# Patient Record
Sex: Female | Born: 1950 | Hispanic: No | Marital: Married | Smoking: Never smoker
Health system: Southern US, Community
[De-identification: ages and names within clinical notes are randomized; demographics above are authoritative.]

## PROBLEM LIST (undated history)

## (undated) DIAGNOSIS — K279 Peptic ulcer, site unspecified, unspecified as acute or chronic, without hemorrhage or perforation: Secondary | ICD-10-CM

## (undated) DIAGNOSIS — I10 Essential (primary) hypertension: Secondary | ICD-10-CM

## (undated) DIAGNOSIS — K449 Diaphragmatic hernia without obstruction or gangrene: Secondary | ICD-10-CM

## (undated) DIAGNOSIS — K219 Gastro-esophageal reflux disease without esophagitis: Secondary | ICD-10-CM

## (undated) DIAGNOSIS — M1990 Unspecified osteoarthritis, unspecified site: Secondary | ICD-10-CM

## (undated) DIAGNOSIS — E0590 Thyrotoxicosis, unspecified without thyrotoxic crisis or storm: Secondary | ICD-10-CM

## (undated) HISTORY — PX: ABDOMINAL HYSTERECTOMY: SHX81

## (undated) HISTORY — DX: Essential (primary) hypertension: I10

## (undated) HISTORY — DX: Diaphragmatic hernia without obstruction or gangrene: K44.9

---

## 2003-10-12 HISTORY — PX: COLONOSCOPY: SHX174

## 2017-06-30 ENCOUNTER — Emergency Department (HOSPITAL_COMMUNITY)
Admission: EM | Admit: 2017-06-30 | Discharge: 2017-06-30 | Disposition: A | Discharge status: Home / Self Care | Payer: BLUE CROSS/BLUE SHIELD

## 2017-12-26 ENCOUNTER — Other Ambulatory Visit: Payer: Self-pay | Admitting: Family Medicine

## 2017-12-26 DIAGNOSIS — Z1231 Encounter for screening mammogram for malignant neoplasm of breast: Secondary | ICD-10-CM

## 2018-01-13 ENCOUNTER — Ambulatory Visit
Admission: RE | Admit: 2018-01-13 | Discharge: 2018-01-13 | Disposition: A | Discharge status: Home / Self Care | Payer: PRIVATE HEALTH INSURANCE | Source: Ambulatory Visit | Attending: Family Medicine | Admitting: Family Medicine

## 2018-01-13 DIAGNOSIS — Z1231 Encounter for screening mammogram for malignant neoplasm of breast: Secondary | ICD-10-CM

## 2019-01-02 ENCOUNTER — Other Ambulatory Visit: Payer: Self-pay | Admitting: Dentistry

## 2019-01-02 ENCOUNTER — Other Ambulatory Visit: Payer: Self-pay | Admitting: Internal Medicine

## 2019-01-02 DIAGNOSIS — Z1231 Encounter for screening mammogram for malignant neoplasm of breast: Secondary | ICD-10-CM

## 2019-02-01 ENCOUNTER — Ambulatory Visit: Payer: PRIVATE HEALTH INSURANCE

## 2019-03-07 ENCOUNTER — Ambulatory Visit
Admission: RE | Admit: 2019-03-07 | Discharge: 2019-03-07 | Disposition: A | Discharge status: Home / Self Care | Payer: PRIVATE HEALTH INSURANCE | Source: Ambulatory Visit | Attending: Internal Medicine | Admitting: Internal Medicine

## 2019-03-07 ENCOUNTER — Other Ambulatory Visit: Payer: Self-pay

## 2019-03-07 DIAGNOSIS — Z1231 Encounter for screening mammogram for malignant neoplasm of breast: Secondary | ICD-10-CM

## 2019-03-09 ENCOUNTER — Other Ambulatory Visit: Payer: Self-pay | Admitting: Internal Medicine

## 2019-03-09 DIAGNOSIS — R921 Mammographic calcification found on diagnostic imaging of breast: Secondary | ICD-10-CM

## 2019-03-14 ENCOUNTER — Other Ambulatory Visit: Payer: Self-pay

## 2019-03-14 ENCOUNTER — Other Ambulatory Visit: Payer: Self-pay | Admitting: Internal Medicine

## 2019-03-14 ENCOUNTER — Ambulatory Visit
Admission: RE | Admit: 2019-03-14 | Discharge: 2019-03-14 | Disposition: A | Discharge status: Home / Self Care | Payer: PRIVATE HEALTH INSURANCE | Source: Ambulatory Visit | Attending: Internal Medicine | Admitting: Internal Medicine

## 2019-03-14 DIAGNOSIS — R921 Mammographic calcification found on diagnostic imaging of breast: Secondary | ICD-10-CM

## 2019-04-09 DIAGNOSIS — Z8616 Personal history of COVID-19: Secondary | ICD-10-CM

## 2019-04-09 HISTORY — DX: Personal history of COVID-19: Z86.16

## 2019-04-14 ENCOUNTER — Emergency Department (HOSPITAL_COMMUNITY): Payer: PRIVATE HEALTH INSURANCE

## 2019-04-14 ENCOUNTER — Encounter (HOSPITAL_COMMUNITY): Payer: Self-pay | Admitting: Emergency Medicine

## 2019-04-14 ENCOUNTER — Inpatient Hospital Stay (HOSPITAL_COMMUNITY)
Admission: EM | Admit: 2019-04-14 | Discharge: 2019-04-17 | DRG: 177 | Disposition: A | Discharge status: Home / Self Care | Payer: PRIVATE HEALTH INSURANCE | Attending: Internal Medicine | Admitting: Internal Medicine

## 2019-04-14 ENCOUNTER — Other Ambulatory Visit: Payer: Self-pay

## 2019-04-14 DIAGNOSIS — J1289 Other viral pneumonia: Secondary | ICD-10-CM

## 2019-04-14 DIAGNOSIS — I10 Essential (primary) hypertension: Secondary | ICD-10-CM | POA: Diagnosis present

## 2019-04-14 DIAGNOSIS — U071 COVID-19: Secondary | ICD-10-CM | POA: Diagnosis not present

## 2019-04-14 DIAGNOSIS — J9601 Acute respiratory failure with hypoxia: Secondary | ICD-10-CM | POA: Diagnosis present

## 2019-04-14 DIAGNOSIS — J1282 Pneumonia due to coronavirus disease 2019: Secondary | ICD-10-CM

## 2019-04-14 DIAGNOSIS — J189 Pneumonia, unspecified organism: Secondary | ICD-10-CM

## 2019-04-14 DIAGNOSIS — E871 Hypo-osmolality and hyponatremia: Secondary | ICD-10-CM | POA: Diagnosis present

## 2019-04-14 HISTORY — DX: Pneumonia, unspecified organism: J18.9

## 2019-04-14 LAB — COMPREHENSIVE METABOLIC PANEL
ALT: 26 U/L (ref 0–44)
AST: 24 U/L (ref 15–41)
Albumin: 3.5 g/dL (ref 3.5–5.0)
Alkaline Phosphatase: 74 U/L (ref 38–126)
Anion gap: 9 (ref 5–15)
BUN: 11 mg/dL (ref 8–23)
CO2: 19 mmol/L — ABNORMAL LOW (ref 22–32)
Calcium: 8.3 mg/dL — ABNORMAL LOW (ref 8.9–10.3)
Chloride: 106 mmol/L (ref 98–111)
Creatinine, Ser: 0.63 mg/dL (ref 0.44–1.00)
GFR calc Af Amer: >60 mL/min (ref >60)
GFR calc non Af Amer: >60 mL/min (ref >60)
Glucose, Bld: 110 mg/dL — ABNORMAL HIGH (ref 70–99)
Potassium: 3.9 mmol/L (ref 3.5–5.1)
Sodium: 134 mmol/L — ABNORMAL LOW (ref 135–145)
Total Bilirubin: 0.9 mg/dL (ref 0.3–1.2)
Total Protein: 6.5 g/dL (ref 6.5–8.1)

## 2019-04-14 LAB — CBC WITH DIFFERENTIAL/PLATELET
Abs Immature Granulocytes: 0.04 10*3/uL (ref 0.00–0.07)
Basophils Absolute: 0 10*3/uL (ref 0.0–0.1)
Basophils Relative: 0 %
Eosinophils Absolute: 0 10*3/uL (ref 0.0–0.5)
Eosinophils Relative: 0 %
HCT: 38.7 % (ref 36.0–46.0)
Hemoglobin: 12.7 g/dL (ref 12.0–15.0)
Immature Granulocytes: 1 %
Lymphocytes Relative: 17 %
Lymphs Abs: 1.4 10*3/uL (ref 0.7–4.0)
MCH: 29.4 pg (ref 26.0–34.0)
MCHC: 32.8 g/dL (ref 30.0–36.0)
MCV: 89.6 fL (ref 80.0–100.0)
Monocytes Absolute: 0.7 10*3/uL (ref 0.1–1.0)
Monocytes Relative: 8 %
Neutro Abs: 6 10*3/uL (ref 1.7–7.7)
Neutrophils Relative %: 74 %
Platelets: 231 10*3/uL (ref 150–400)
RBC: 4.32 MIL/uL (ref 3.87–5.11)
RDW: 12.6 % (ref 11.5–15.5)
WBC: 8.1 10*3/uL (ref 4.0–10.5)
nRBC: 0 % (ref 0.0–0.2)

## 2019-04-14 LAB — LACTATE DEHYDROGENASE: LDH: 210 U/L — ABNORMAL HIGH (ref 98–192)

## 2019-04-14 LAB — D-DIMER, QUANTITATIVE: D-Dimer, Quant: 0.55 ug/mL-FEU — ABNORMAL HIGH (ref 0.00–0.50)

## 2019-04-14 LAB — FIBRINOGEN: Fibrinogen: 593 mg/dL — ABNORMAL HIGH (ref 210–475)

## 2019-04-14 LAB — PROCALCITONIN: Procalcitonin: <0.1 ng/mL

## 2019-04-14 LAB — LACTIC ACID, PLASMA: Lactic Acid, Venous: 0.7 mmol/L (ref 0.5–1.9)

## 2019-04-14 LAB — C-REACTIVE PROTEIN: CRP: 8.4 mg/dL — ABNORMAL HIGH (ref <1.0)

## 2019-04-14 LAB — FERRITIN: Ferritin: 275 ng/mL (ref 11–307)

## 2019-04-14 LAB — TRIGLYCERIDES: Triglycerides: 135 mg/dL (ref <150)

## 2019-04-14 NOTE — ED Notes (Signed)
Clicked collect for lactic acid and blood cultures by mistake, phlebotomy at bedside to collect and is aware.

## 2019-04-14 NOTE — ED Provider Notes (Signed)
[facility] EMERGENCY DEPARTMENT Provider Note   CSN: [id number] Arrival date & time: 04/14/19  1903    History   Chief Complaint Chief Complaint  Patient presents with  . Shortness of Breath    HPI [facility] Amison is a 68 y.o. female with history of hypertension is here for evaluation of worsening [place] symptoms.  Patient symptoms began on Monday.  She was tested at [facility] on [address] on Monday and obtained positive results on Thursday.  She reports associated productive, frequent cough of clear sputum, headaches described as mild and pulsating, exertional shortness of breath and chest pain that is now constant and at rest as well, decreased appetite, mild raw/sore throat, lack of appetite, loss of taste and smell, generalized fatigue, weakness and intermittent abdominal pain.  Her PCP gave her a medicine for cough which she has been taking without relief.  She denies any fever, congestion, rhinorrhea, nausea, vomiting, diarrhea, rashes.  She lives with her husband who is getting tested for COVID but he has no symptoms.  Her grandson just moved in with her yesterday and he has no symptoms either.  She works at [employer] which is a [employer] and states that 3 of her coworkers recently tested positive for [place].  She takes amlodipine and another blood pressure medicine for hypertension but denies any other medical issues or medicines.  Had a CT scan 1 year ago and was told that there was something in her right lung that she supposed to follow-up in 1 year for monitoring.  No tobacco use.    HPI  History reviewed. No pertinent past medical history.  Patient Active Problem List   Diagnosis Date Noted  . Pneumonia due to COVID-19 virus 04/14/2019    History reviewed. No pertinent surgical history.   OB History   No obstetric history on file.      Home Medications    Prior to Admission medications   Not on File    Family History History  reviewed. No pertinent family history.  Social History Social History   Tobacco Use  . Smoking status: Never Smoker  . Smokeless tobacco: Never Used  Substance Use Topics  . Alcohol use: Never    Frequency: Never  . Drug use: Never     Allergies   Patient has no allergy information on record.   Review of Systems Review of Systems  Constitutional: Positive for appetite change, diaphoresis and fatigue.  Respiratory: Positive for cough and shortness of breath.   Cardiovascular: Positive for chest pain.  Gastrointestinal: Positive for abdominal pain.  Neurological: Positive for weakness (Generalized) and headaches.  All other systems reviewed and are negative.    Physical Exam Updated Vital Signs BP 125/70 (BP Location: Right Arm)   Pulse 82   Temp 99.8 F (37.7 C) (Oral)   Resp (!) 28   Ht 5\' 6"  (1.676 m)   Wt 66.2 kg   SpO2 96%   BMI 23.57 kg/m   Physical Exam Vitals signs and nursing note reviewed.  Constitutional:      Appearance: She is well-developed. She is ill-appearing.     Comments: Looks uncomfortable, alert.  HENT:     Head: Normocephalic and atraumatic.     Nose: Nose normal.     Mouth/Throat:     Mouth: Mucous membranes are dry.     Comments: Dry mucous membranes.  Oropharynx and tonsils normal. Eyes:     Conjunctiva/sclera: Conjunctivae normal.  Neck:  Musculoskeletal: Normal range of motion.  Cardiovascular:     Rate and Rhythm: Normal rate and regular rhythm.     Comments: 1+ radial and DP pulses bilaterally.  No lower extremity edema.  No murmurs.  No calf tenderness. Pulmonary:     Effort: Respiratory distress (Mild) present.     Breath sounds: Examination of the right-lower field reveals wheezing. Examination of the left-lower field reveals wheezing. Wheezing present.     Comments: Tachypnea RR in the 30s.  Prolonged expiration.  Speaking in full sentences but appears slightly tired.  Faint end expiratory wheezing to lower lobes  posteriorly.  No crackles.  Frequent cough during exam. Abdominal:     General: Bowel sounds are normal.     Palpations: Abdomen is soft.     Tenderness: There is no abdominal tenderness.  Musculoskeletal: Normal range of motion.  Skin:    General: Skin is warm and dry.     Capillary Refill: Capillary refill takes less than 2 seconds.  Neurological:     Mental Status: She is alert.  Psychiatric:        Behavior: Behavior normal.      ED Treatments / Results  Labs (all labs ordered are listed, but only abnormal results are displayed) Labs Reviewed  COMPREHENSIVE METABOLIC PANEL - Abnormal; Notable for the following components:      Result Value   Sodium 134 (*)    CO2 19 (*)    Glucose, Bld 110 (*)    Calcium 8.3 (*)    All other components within normal limits  D-DIMER, QUANTITATIVE (NOT AT [facility]) - Abnormal; Notable for the following components:   D-Dimer, Quant 0.55 (*)    All other components within normal limits  LACTATE DEHYDROGENASE - Abnormal; Notable for the following components:   LDH 210 (*)    All other components within normal limits  FIBRINOGEN - Abnormal; Notable for the following components:   Fibrinogen 593 (*)    All other components within normal limits  C-REACTIVE PROTEIN - Abnormal; Notable for the following components:   CRP 8.4 (*)    All other components within normal limits  CULTURE, BLOOD (ROUTINE X 2)  CULTURE, BLOOD (ROUTINE X 2)  LACTIC ACID, PLASMA  CBC WITH DIFFERENTIAL/PLATELET  PROCALCITONIN  FERRITIN  TRIGLYCERIDES  LACTIC ACID, PLASMA    EKG EKG Interpretation  Date/Time:  Saturday April 14 2019 19:19:05 EDT Ventricular Rate:  88 PR Interval:    QRS Duration: 87 QT Interval:  353 QTC Calculation: 428 R Axis:   71 Text Interpretation:  Sinus rhythm Baseline wander in lead(s) II III aVF V1 V2 V3 V4 V5 Confirmed by Virgina NorfolkAdam, Curatolo [phone]) on 04/14/2019 8:13:49 PM   Radiology Dg Chest Port 1 View  Result Date: 04/14/2019  CLINICAL DATA:  Dyspnea, COVID-19 positive EXAM: PORTABLE CHEST 1 VIEW COMPARISON:  None. FINDINGS: Normal heart size. Normal mediastinal contour. No pneumothorax. No convincing pleural effusion. Extensive patchy opacities throughout the periphery of both lungs, most prominent in the peripheral upper left lung. IMPRESSION: Extensive patchy opacities throughout the periphery of both lungs, compatible with COVID-19 pneumonia. Electronically Signed   By: Delbert PhenixJason A Poff M.D.   On: 04/14/2019 20:52    Procedures Procedures (including critical care time)  Medications Ordered in ED Medications - No data to display   Initial Impression / Assessment and Plan / ED Course  I have reviewed the triage vital signs and the nursing notes.  Pertinent labs & imaging results that were  available during my care of the patient were reviewed by me and considered in my medical decision making (see chart for details).  Clinical Course as of Apr 13 2254  Sat Apr 14, 2019  2136 IMPRESSION: Extensive patchy opacities throughout the periphery of both lungs, compatible with COVID-19 pneumonia.  DG Chest Port 1 View [CG]  2136 Creatinine: 0.63 [CG]  2136 Fibrinogen(!): 593 [CG]  2136 D-Dimer, Quant(!): 0.55 [CG]  2136 LDH(!): 210 [CG]  2136 CRP(!): 8.4 [CG]  2136 WBC: 8.1 [CG]  2136 Hemoglobin: 12.7 [CG]  2136 Lactic Acid, Venous: 0.7 [CG]  2136 Resp(!): 28 [CG]  2136 Temp: 99.4 F (37.4 C) [CG]    Clinical Course User Index [CG] Liberty HandyGibbons, Sanaiya Welliver J, PA-C   I reviewed patient's EMR to obtain pertinent PMH.  68 year old with HTN presents with persistent, worsening symptoms due to COVID-19.  She is now dyspneic at rest, on exertion.  Had positive test at outside facility, available on her chart which was obtained on Monday.  On exam she has increased respiratory effort with end expiratory wheezing, prolonged exhalation and tachypnea.  Seems that she is compensating for now as she does not become hypoxemic on  exertion, her SPO2 remained greater than 94% on room air during 5-minute walking trial here in the ER but she had significant increase in work of breathing, tachypnea.  Labs reviewed and remarkable as above.  Chest x-ray shows extensive opacities bilaterally.  No leukocytosis.  No lactic acidosis.  Hemoglobin normal.  Inflammatory markers as above likely from a COVID-19.  EKG without ischemia.  She reports fleeting substernal left-sided chest pain with exertion, cough and I doubt ACS given overall picture.  Given age, duration of symptoms, chest x-ray findings, increased work of breathing, tachypnea I am concerned that patient may deteriorate over the next few days and feel she will do best with admission.  I have discussed your findings with patient and explained reasoning for admission/observation.  She is worried about coming into the hospital but is agreeable.  Discussed with Dr. Antionette Charpyd who is accepted patient.  Shared with EDP. Final Clinical Impressions(s) / ED Diagnoses   Patient discussed with Dr. open was accepted patient.  Shared visit with EDP.  Final diagnoses:  Pneumonia due to COVID-19 virus    ED Discharge Orders    None       Liberty HandyGibbons, Ileen Kahre J, PA-C 04/14/19 2255    Virgina Norfolkuratolo, Adam, DO 04/14/19 2335

## 2019-04-14 NOTE — ED Provider Notes (Signed)
Medical screening examination/treatment/procedure(s) were conducted as a shared visit with non-physician practitioner(s) and myself.  I personally evaluated the patient during the encounter. Briefly, the patient is a 68 y.o. female with no significant medical history presents the ED with shortness of breath.  Patient recently tested positive for coronavirus.  Patient tachypneic, low-grade fever.  Patient looks to have increased work of breathing.  Coarse breath sounds throughout.  Chest x-ray shows patchy opacities consistent with coronavirus.  Patient with no abdominal pain, chest pain.  No significant respiratory history.  Will place on oxygen for support.  Patient with no significant leukocytosis, anemia, electrolyte abnormality.  Lactic acid normal.  Overall given chest x-ray and patient's respiratory effort believe patient would benefit from admission and observation.  Will contact hospitalist.  This chart was dictated using voice recognition software.  Despite best efforts to proofread,  errors can occur which can change the documentation meaning.     EKG Interpretation  Date/Time:  Saturday April 14 2019 19:19:05 EDT Ventricular Rate:  88 PR Interval:    QRS Duration: 87 QT Interval:  353 QTC Calculation: 428 R Axis:   71 Text Interpretation:  Sinus rhythm Baseline wander in lead(s) II III aVF V1 V2 V3 V4 V5 Confirmed by Lennice Sites [id number]) on 04/14/2019 8:13:49 PM          Lennice Sites, DO 04/14/19 2151

## 2019-04-14 NOTE — ED Notes (Addendum)
Pt ambulated in room, SpO2 was 94 or greater at all times.  Respiratory rate increased to 32.

## 2019-04-14 NOTE — ED Triage Notes (Signed)
Pt from home reports SHOB and weakness. Pt tested COVID positive last week. Pt c/o SHOB, coughing, headaches, weakness, loss of appetite, and CP when she coughs too long. Pt denies N/V.

## 2019-04-14 NOTE — H&P (Signed)
History and Physical    Courtney Richard [id number] DOB: July 13, 1951 DOA: 04/14/2019  PCP: Harvie HeckJackson, Mishi, MD   Patient coming from: Home   Chief Complaint: Cough, SOB, headache, anorexia, malaise   HPI: Courtney IdlerRosa Marie Magdalena Richard is a 68 y.o. female who denies any significant past medical history, now presenting to the emergency department with progressive shortness of breath, cough, anorexia, and malaise after being diagnosed with COVID-19 on 04/09/2019.  Patient reports that multiple coworkers had been diagnosed with COVID-19, she developed a cough, exertional dyspnea, impaired taste and smell, and general malaise about a week ago, was tested for the novel coronavirus on 04/09/2019 and this returned positive.  Since that time, she has had progressive dyspnea and malaise.  She is now short of breath at rest, much worse with mild exertion.  ED Course: Upon arrival to the ED, patient is found to have a temperature of 37.7 C, saturating mid 90s, tachycardic to the 30s, and with stable blood pressure.  EKG features a sinus rhythm and chest x-ray reveals extensive patchy opacity throughout the periphery of both lungs consistent with COVID pneumonia.  Chemistry panel is notable for slight hyponatremia and low bicarbonate.  CBC is unremarkable.  Lactic acid is normal.  Procalcitonin undetectable.  Age-adjusted d-dimer is negative.  Blood cultures were collected in the ED.  Patient attempted to ambulate a short distance in her ED room, maintain saturations in the 90s, but quickly became tachypneic into the 30s and felt as though she was going to pass out.  Hospitalists are consulted for admission.  Review of Systems:  All other systems reviewed and apart from HPI, are negative.  History reviewed. No pertinent past medical history.  History reviewed. No pertinent surgical history.   reports that she has never smoked. She has never used smokeless tobacco. She reports that she does not  drink alcohol or use drugs.  Not on File  History reviewed. No pertinent family history.   Prior to Admission medications   Not on File    Physical Exam: Vitals:   04/14/19 1917 04/14/19 1925 04/14/19 2155  BP:  126/76 125/70  Pulse:  90 82  Resp:  (!) 28 (!) 28  Temp:  99.4 F (37.4 C) 99.8 F (37.7 C)  TempSrc:   Oral  SpO2:  96% 96%  Weight: 66.2 kg    Height: 5\' 6"  (1.676 m)      Constitutional: Dyspneic with speech, no pallor, no diaphoresis  Eyes: PERTLA, lids and conjunctivae normal ENMT: Mucous membranes are moist. Posterior pharynx clear of any exudate or lesions.   Neck: normal, supple, no masses, no thyromegaly Respiratory: Mild tachypnea and dyspnea at rest. No accessory muscle use.  Cardiovascular: S1 & S2 heard, regular rate and rhythm. No extremity edema.  Abdomen: No distension, soft, mild generalized tenderness without rebound pain or guarding. Bowel sounds active.  Musculoskeletal: no clubbing / cyanosis. No joint deformity upper and lower extremities.    Skin: no significant rashes, lesions, ulcers. Warm, dry, well-perfused. Neurologic: CN 2-12 grossly intact. Sensation intact. Strength 5/5 in all 4 limbs.  Psychiatric: Alert and oriented x 3. Very pleasant, coooperative.    Labs on Admission: I have personally reviewed following labs and imaging studies  CBC: Recent Labs  Lab 04/14/19 2037  WBC 8.1  NEUTROABS 6.0  HGB 12.7  HCT 38.7  MCV 89.6  PLT 231   Basic Metabolic Panel: Recent Labs  Lab 04/14/19 2037  NA 134*  K 3.9  CL 106  CO2 19*  GLUCOSE 110*  BUN 11  CREATININE 0.63  CALCIUM 8.3*   GFR: Estimated Creatinine Clearance: 63.9 mL/min (by C-G formula based on SCr of 0.63 mg/dL). Liver Function Tests: Recent Labs  Lab 04/14/19 2037  AST 24  ALT 26  ALKPHOS 74  BILITOT 0.9  PROT 6.5  ALBUMIN 3.5   No results for input(s): LIPASE, AMYLASE in the last 168 hours. No results for input(s): AMMONIA in the last 168 hours.  Coagulation Profile: No results for input(s): INR, PROTIME in the last 168 hours. Cardiac Enzymes: No results for input(s): CKTOTAL, CKMB, CKMBINDEX, TROPONINI in the last 168 hours. BNP (last 3 results) No results for input(s): PROBNP in the last 8760 hours. HbA1C: No results for input(s): HGBA1C in the last 72 hours. CBG: No results for input(s): GLUCAP in the last 168 hours. Lipid Profile: Recent Labs    04/14/19 2037  TRIG 135   Thyroid Function Tests: No results for input(s): TSH, T4TOTAL, FREET4, T3FREE, THYROIDAB in the last 72 hours. Anemia Panel: Recent Labs    04/14/19 2037  FERRITIN 275   Urine analysis: No results found for: COLORURINE, APPEARANCEUR, LABSPEC, PHURINE, GLUCOSEU, HGBUR, BILIRUBINUR, KETONESUR, PROTEINUR, UROBILINOGEN, NITRITE, LEUKOCYTESUR Sepsis Labs: @LABRCNTIP (procalcitonin:4,lacticidven:4) )No results found for this or any previous visit (from the past 240 hour(s)).   Radiological Exams on Admission: Dg Chest Port 1 View  Result Date: 04/14/2019 CLINICAL DATA:  Dyspnea, COVID-19 positive EXAM: PORTABLE CHEST 1 VIEW COMPARISON:  None. FINDINGS: Normal heart size. Normal mediastinal contour. No pneumothorax. No convincing pleural effusion. Extensive patchy opacities throughout the periphery of both lungs, most prominent in the peripheral upper left lung. IMPRESSION: Extensive patchy opacities throughout the periphery of both lungs, compatible with COVID-19 pneumonia. Electronically Signed   By: Ilona Sorrel M.D.   On: 04/14/2019 20:52    EKG: Independently reviewed. Sinus rhythm, QTc 428 ms.   Assessment/Plan   1. Pneumonia secondary to COVID-19   - Presents with progressive SOB, anorexia, and malaise after developing sxs ~1 wk earlier and testing positive for COVID-19 on 04/09/19 (as seen in "care everywhere") - CXR features extensive patchy opacities through periphery of both lungs  - Procalcitonin undetectable  - She is mildly tachypneic at  rest, able to speak a full sentence, but becomes distressed with taking only a few steps and there is concern that she may continue to worsen  - Continue supportive care, self-prone, as-needed supplemental O2, trend inflammatory markers, continue infection-control precautions     PPE: CAPR, gown, gloves. Patient wearing mask.  DVT prophylaxis: Lovenox  Code Status: Full  Family Communication: Discussed with patient   Consults called: None Admission status: Observation     Vianne Bulls, MD [facility] Pager [phone]  If 7PM-7AM, please contact night-coverage [email or web address] Password [facility]  04/14/2019, 10:43 PM

## 2019-04-15 DIAGNOSIS — J1289 Other viral pneumonia: Secondary | ICD-10-CM | POA: Diagnosis not present

## 2019-04-15 DIAGNOSIS — I10 Essential (primary) hypertension: Secondary | ICD-10-CM | POA: Diagnosis not present

## 2019-04-15 DIAGNOSIS — U071 COVID-19: Secondary | ICD-10-CM | POA: Diagnosis present

## 2019-04-15 DIAGNOSIS — J9601 Acute respiratory failure with hypoxia: Secondary | ICD-10-CM | POA: Diagnosis not present

## 2019-04-15 DIAGNOSIS — E871 Hypo-osmolality and hyponatremia: Secondary | ICD-10-CM | POA: Diagnosis not present

## 2019-04-15 LAB — COMPREHENSIVE METABOLIC PANEL
ALT: 24 U/L (ref 0–44)
AST: 23 U/L (ref 15–41)
Albumin: 3.5 g/dL (ref 3.5–5.0)
Alkaline Phosphatase: 74 U/L (ref 38–126)
Anion gap: 8 (ref 5–15)
BUN: 13 mg/dL (ref 8–23)
CO2: 23 mmol/L (ref 22–32)
Calcium: 8.4 mg/dL — ABNORMAL LOW (ref 8.9–10.3)
Chloride: 106 mmol/L (ref 98–111)
Creatinine, Ser: 0.6 mg/dL (ref 0.44–1.00)
GFR calc Af Amer: >60 mL/min (ref >60)
GFR calc non Af Amer: >60 mL/min (ref >60)
Glucose, Bld: 106 mg/dL — ABNORMAL HIGH (ref 70–99)
Potassium: 3.6 mmol/L (ref 3.5–5.1)
Sodium: 137 mmol/L (ref 135–145)
Total Bilirubin: 0.6 mg/dL (ref 0.3–1.2)
Total Protein: 6.6 g/dL (ref 6.5–8.1)

## 2019-04-15 LAB — CBC WITH DIFFERENTIAL/PLATELET
Abs Immature Granulocytes: 0.05 10*3/uL (ref 0.00–0.07)
Basophils Absolute: 0 10*3/uL (ref 0.0–0.1)
Basophils Relative: 0 %
Eosinophils Absolute: 0 10*3/uL (ref 0.0–0.5)
Eosinophils Relative: 0 %
HCT: 36 % (ref 36.0–46.0)
Hemoglobin: 11.8 g/dL — ABNORMAL LOW (ref 12.0–15.0)
Immature Granulocytes: 1 %
Lymphocytes Relative: 22 %
Lymphs Abs: 1.5 10*3/uL (ref 0.7–4.0)
MCH: 29.2 pg (ref 26.0–34.0)
MCHC: 32.8 g/dL (ref 30.0–36.0)
MCV: 89.1 fL (ref 80.0–100.0)
Monocytes Absolute: 0.7 10*3/uL (ref 0.1–1.0)
Monocytes Relative: 9 %
Neutro Abs: 4.7 10*3/uL (ref 1.7–7.7)
Neutrophils Relative %: 68 %
Platelets: 248 10*3/uL (ref 150–400)
RBC: 4.04 MIL/uL (ref 3.87–5.11)
RDW: 12.7 % (ref 11.5–15.5)
WBC: 7 10*3/uL (ref 4.0–10.5)
nRBC: 0 % (ref 0.0–0.2)

## 2019-04-15 LAB — ABO/RH: ABO/RH(D): O POS

## 2019-04-15 LAB — D-DIMER, QUANTITATIVE: D-Dimer, Quant: 0.43 ug/mL-FEU (ref 0.00–0.50)

## 2019-04-15 LAB — HIV ANTIBODY (ROUTINE TESTING W REFLEX): HIV Screen 4th Generation wRfx: NONREACTIVE

## 2019-04-15 LAB — C-REACTIVE PROTEIN: CRP: 7.3 mg/dL — ABNORMAL HIGH (ref <1.0)

## 2019-04-15 MED ORDER — ONDANSETRON HCL 4 MG PO TABS: 4.0000 mg | ORAL_TABLET | Freq: Four times a day (QID) | ORAL | Status: DC | PRN

## 2019-04-15 MED ORDER — SODIUM CHLORIDE 0.9 % IV SOLN: 250.0000 mL | INTRAVENOUS | Status: DC | PRN

## 2019-04-15 MED ORDER — ONDANSETRON HCL 4 MG/2ML IJ SOLN: 4.0000 mg | Freq: Four times a day (QID) | INTRAMUSCULAR | Status: DC | PRN

## 2019-04-15 MED ORDER — POLYETHYLENE GLYCOL 3350 17 G PO PACK: 17.0000 g | PACK | Freq: Every day | ORAL | Status: DC | PRN

## 2019-04-15 MED ORDER — ZOLPIDEM TARTRATE 5 MG PO TABS: 5.0000 mg | ORAL_TABLET | Freq: Every evening | ORAL | Status: DC | PRN

## 2019-04-15 MED ORDER — ALBUTEROL SULFATE HFA 108 (90 BASE) MCG/ACT IN AERS
2.0000 | INHALATION_SPRAY | RESPIRATORY_TRACT | Status: DC | PRN
  Filled 2019-04-15: qty 6.7

## 2019-04-15 MED ORDER — ACETAMINOPHEN 325 MG PO TABS
650.0000 mg | ORAL_TABLET | Freq: Four times a day (QID) | ORAL | Status: DC | PRN
  Administered 2019-04-15: 650 mg via ORAL
  Filled 2019-04-15: qty 2

## 2019-04-15 MED ORDER — GUAIFENESIN-DM 100-10 MG/5ML PO SYRP
5.0000 mL | ORAL_SOLUTION | ORAL | Status: DC | PRN
  Administered 2019-04-15 – 2019-04-16 (×3): 5 mL via ORAL
  Filled 2019-04-15 (×3): qty 10

## 2019-04-15 MED ORDER — SODIUM CHLORIDE 0.9% FLUSH: 3.0000 mL | INTRAVENOUS | Status: DC | PRN

## 2019-04-15 MED ORDER — HYDROCODONE-ACETAMINOPHEN 5-325 MG PO TABS: 1.0000 | ORAL_TABLET | ORAL | Status: DC | PRN

## 2019-04-15 MED ORDER — SODIUM CHLORIDE 0.9% FLUSH
3.0000 mL | Freq: Two times a day (BID) | INTRAVENOUS | Status: DC
  Administered 2019-04-15 – 2019-04-17 (×6): 3 mL via INTRAVENOUS

## 2019-04-15 MED ORDER — ENOXAPARIN SODIUM 40 MG/0.4ML ~~LOC~~ SOLN
40.0000 mg | SUBCUTANEOUS | Status: DC
  Administered 2019-04-15 – 2019-04-17 (×3): 40 mg via SUBCUTANEOUS
  Filled 2019-04-15 (×3): qty 0.4

## 2019-04-15 NOTE — Progress Notes (Addendum)
Progress note   Courtney Richard [id number] DOB: [birth year] DOA: 04/14/2019  PCP: Courtney Messick, MD   Patient coming from: Home   Chief Complaint: Cough, SOB, headache, anorexia, malaise   HPI: Courtney Richard is a 68 y.o. female who denies any significant past medical history, now presenting to the emergency department with progressive shortness of breath, cough, anorexia, and malaise after being diagnosed with COVID-19 on 04/09/2019.  Patient reports that multiple coworkers had been diagnosed with COVID-19, she developed a cough, exertional dyspnea, impaired taste and smell, and general malaise about a week ago, was tested for the novel coronavirus on 04/09/2019 and this returned positive.  Since that time, she has had progressive dyspnea and malaise.  She is now short of breath at rest, much worse with mild exertion. Upon arrival to the ED, patient is found to have a temperature of 37.7 C, saturating mid 90s, tachycardic to the 30s, and with stable blood pressure.  EKG features a sinus rhythm and chest x-ray reveals extensive patchy opacity throughout the periphery of both lungs consistent with COVID pneumonia.  Chemistry panel is notable for slight hyponatremia and low bicarbonate.  CBC is unremarkable.  Lactic acid is normal.  Procalcitonin undetectable.  Age-adjusted d-dimer is negative.  Blood cultures were collected in the ED.  Patient attempted to ambulate a short distance in her ED room, maintain saturations in the 90s, but quickly became tachypneic into the 30s and felt as though she was going to pass out.  Hospitalists are consulted for admission.  Assessment/Plan   Pneumonia secondary to COVID-19, without hypoxia  - Presents with progressive SOB, anorexia, and malaise after developing sxs ~1 wk earlier and testing positive for COVID-19 on 04/09/19 (as seen in "care everywhere") - CXR features extensive patchy opacities through periphery of both lungs  -  Procalcitonin undetectable  - She is mildly tachypneic at rest, able to speak a full sentence, but becomes distressed with taking only a few steps and there is concern that she may continue to worsen  - Continue supportive care, self-prone, as-needed supplemental O2, trend inflammatory markers, continue infection-control precautions -**Afternoon update - patient moderately symptomatic as well as hypoxic to 87% with even minimal exertion**  Recent Labs    04/14/19 2037 04/15/19 0525  DDIMER 0.55* 0.43  FERRITIN 275  --   LDH 210*  --   CRP 8.4* 7.3*    PPE: CAPR, gown, gloves. Patient wearing mask.  DVT prophylaxis: Lovenox  Code Status: Full  Family Communication: Discussed with patient   Consults called: None Admission status: inpatient  Subjective:  No acute issues or events overnight, patient states her respiratory status is slightly improved from previous, continues to feel somewhat weak in complaining of general malaise. Declines chest pain, nausea, vomiting, diarrhea, constipation, headache, fevers, chills.  All other systems reviewed and apart from HPI, are negative.  Physical Exam: Vitals:   04/15/19 0400 04/15/19 0500 04/15/19 0600 04/15/19 0822  BP:   115/63 120/68  Pulse: 78 68 66 75  Resp:   16   Temp:   98.1 F (36.7 C) 98.8 F (37.1 C)  TempSrc:   Oral Oral  SpO2: 95% 95% 94% 97%  Weight:      Height:        Constitutional: Mildly dyspneic with speech, no pallor, no diaphoresis  Eyes: PERTLA, lids and conjunctivae normal ENMT: Mucous membranes are moist. Posterior pharynx clear of any exudate or lesions.   Neck: normal, supple, no  masses, no thyromegaly Respiratory: Mild tachypnea and dyspnea at rest. Without accessory muscle use.  Cardiovascular: S1 & S2 heard, regular rate and rhythm. No extremity edema.  Abdomen: No distension, soft, mild generalized tenderness without rebound pain or guarding. Bowel sounds active.  Musculoskeletal: no clubbing /  cyanosis. No joint deformity upper and lower extremities.    Skin: no significant rashes, lesions, ulcers. Warm, dry, well-perfused. Neurologic: CN 2-12 grossly intact. Sensation intact. Strength 5/5 in all 4 limbs.  Psychiatric: Alert and oriented x 3. Very pleasant, coooperative.    Labs on Admission: I have personally reviewed following labs and imaging studies  CBC: Recent Labs  Lab 04/14/19 2037 04/15/19 0525  WBC 8.1 7.0  NEUTROABS 6.0 4.7  HGB 12.7 11.8*  HCT 38.7 36.0  MCV 89.6 89.1  PLT 231 248   Basic Metabolic Panel: Recent Labs  Lab 04/14/19 2037 04/15/19 0525  NA 134* 137  K 3.9 3.6  CL 106 106  CO2 19* 23  GLUCOSE 110* 106*  BUN 11 13  CREATININE 0.63 0.60  CALCIUM 8.3* 8.4*   GFR: Estimated Creatinine Clearance: 63.9 mL/min (by C-G formula based on SCr of 0.6 mg/dL). Liver Function Tests: Recent Labs  Lab 04/14/19 2037 04/15/19 0525  AST 24 23  ALT 26 24  ALKPHOS 74 74  BILITOT 0.9 0.6  PROT 6.5 6.6  ALBUMIN 3.5 3.5   No results for input(s): LIPASE, AMYLASE in the last 168 hours. No results for input(s): AMMONIA in the last 168 hours. Coagulation Profile: No results for input(s): INR, PROTIME in the last 168 hours. Cardiac Enzymes: No results for input(s): CKTOTAL, CKMB, CKMBINDEX, TROPONINI in the last 168 hours. BNP (last 3 results) No results for input(s): PROBNP in the last 8760 hours. HbA1C: No results for input(s): HGBA1C in the last 72 hours. CBG: No results for input(s): GLUCAP in the last 168 hours. Lipid Profile: Recent Labs    04/14/19 2037  TRIG 135   Thyroid Function Tests: No results for input(s): TSH, T4TOTAL, FREET4, T3FREE, THYROIDAB in the last 72 hours. Anemia Panel: Recent Labs    04/14/19 2037  FERRITIN 275   Urine analysis: No results found for: COLORURINE, APPEARANCEUR, LABSPEC, PHURINE, GLUCOSEU, HGBUR, BILIRUBINUR, KETONESUR, PROTEINUR, UROBILINOGEN, NITRITE, LEUKOCYTESUR  Radiological Exams on  Admission: Dg Chest Port 1 View  Result Date: 04/14/2019 CLINICAL DATA:  Dyspnea, COVID-19 positive EXAM: PORTABLE CHEST 1 VIEW COMPARISON:  None. FINDINGS: Normal heart size. Normal mediastinal contour. No pneumothorax. No convincing pleural effusion. Extensive patchy opacities throughout the periphery of both lungs, most prominent in the peripheral upper left lung. IMPRESSION: Extensive patchy opacities throughout the periphery of both lungs, compatible with COVID-19 pneumonia. Electronically Signed   By: Delbert PhenixJason A Poff M.D.   On: 04/14/2019 20:52    EKG: Independently reviewed. Sinus rhythm, QTc 428 ms.   Azucena FallenWilliam C Shay Jhaveri, MD [facility] Pager [phone]  If 7PM-7AM, please contact night-coverage [email or web address] Password [email or web address]  04/15/2019, 2:41 PM

## 2019-04-15 NOTE — Progress Notes (Signed)
Updated patients emergency contact whom speaks minimal english. Patients grandson took the phone and translated the conversation. Encouraged patients husband to prone as he is Covid positive as well. Answered all questions and updated them on patient status and plan of care.

## 2019-04-15 NOTE — ED Notes (Signed)
Called [facility] for transport to [facility], will call me when on the way

## 2019-04-15 NOTE — Progress Notes (Signed)
Patient arrived to unit and walked to bed. Proning, incentive spirometer and stratus use explained although limited due to language barrier. Stratus use attempted but unable to work due to connection problems. Policies explained awaiting pharmacy approval to address pain.

## 2019-04-15 NOTE — Plan of Care (Signed)

## 2019-04-15 NOTE — Progress Notes (Deleted)
History and Physical    Courtney IdlerRosa Marie Magdalena Richard ONG:[id number] DOB: August 02, 1951 DOA: 04/14/2019  PCP: Harvie HeckJackson, Mishi, MD   Patient coming from: Home   Chief Complaint: Cough, SOB, headache, anorexia, malaise   HPI: Courtney IdlerRosa Marie Magdalena Richard is a 68 y.o. female who denies any significant past medical history, now presenting to the emergency department with progressive shortness of breath, cough, anorexia, and malaise after being diagnosed with COVID-19 on 04/09/2019.  Patient reports that multiple coworkers had been diagnosed with COVID-19, she developed a cough, exertional dyspnea, impaired taste and smell, and general malaise about a week ago, was tested for the novel coronavirus on 04/09/2019 and this returned positive.  Since that time, she has had progressive dyspnea and malaise.  She is now short of breath at rest, much worse with mild exertion. Upon arrival to the ED, patient is found to have a temperature of 37.7 C, saturating mid 90s, tachycardic to the 30s, and with stable blood pressure.  EKG features a sinus rhythm and chest x-ray reveals extensive patchy opacity throughout the periphery of both lungs consistent with COVID pneumonia.  Chemistry panel is notable for slight hyponatremia and low bicarbonate.  CBC is unremarkable.  Lactic acid is normal.  Procalcitonin undetectable.  Age-adjusted d-dimer is negative.  Blood cultures were collected in the ED.  Patient attempted to ambulate a short distance in her ED room, maintain saturations in the 90s, but quickly became tachypneic into the 30s and felt as though she was going to pass out.  Hospitalists are consulted for admission.  Assessment/Plan   Pneumonia secondary to COVID-19, without hypoxia  - Presents with progressive SOB, anorexia, and malaise after developing sxs ~1 wk earlier and testing positive for COVID-19 on 04/09/19 (as seen in "care everywhere") - CXR features extensive patchy opacities through periphery of both lungs  -  Procalcitonin undetectable  - She is mildly tachypneic at rest, able to speak a full sentence, but becomes distressed with taking only a few steps and there is concern that she may continue to worsen  - Continue supportive care, self-prone, as-needed supplemental O2, trend inflammatory markers, continue infection-control precautions  Recent Labs    04/14/19 2037 04/15/19 0525  DDIMER 0.55* 0.43  FERRITIN 275  --   LDH 210*  --   CRP 8.4* 7.3*    PPE: CAPR, gown, gloves. Patient wearing mask.  DVT prophylaxis: Lovenox  Code Status: Full  Family Communication: Discussed with patient   Consults called: None Admission status: inpatient  Subjective:  No acute issues or events overnight, patient states her respiratory status is slightly improved from previous, continues to feel somewhat weak in complaining of general malaise. Declines chest pain, nausea, vomiting, diarrhea, constipation, headache, fevers, chills.  All other systems reviewed and apart from HPI, are negative.  Physical Exam: Vitals:   04/15/19 0400 04/15/19 0500 04/15/19 0600 04/15/19 0822  BP:   115/63 120/68  Pulse: 78 68 66 75  Resp:   16   Temp:   98.1 F (36.7 C) 98.8 F (37.1 C)  TempSrc:   Oral Oral  SpO2: 95% 95% 94% 97%  Weight:      Height:        Constitutional: Mildly dyspneic with speech, no pallor, no diaphoresis  Eyes: PERTLA, lids and conjunctivae normal ENMT: Mucous membranes are moist. Posterior pharynx clear of any exudate or lesions.   Neck: normal, supple, no masses, no thyromegaly Respiratory: Mild tachypnea and dyspnea at rest. Without accessory muscle use.  Cardiovascular: S1 & S2 heard, regular rate and rhythm. No extremity edema.  Abdomen: No distension, soft, mild generalized tenderness without rebound pain or guarding. Bowel sounds active.  Musculoskeletal: no clubbing / cyanosis. No joint deformity upper and lower extremities.    Skin: no significant rashes, lesions, ulcers. Warm,  dry, well-perfused. Neurologic: CN 2-12 grossly intact. Sensation intact. Strength 5/5 in all 4 limbs.  Psychiatric: Alert and oriented x 3. Very pleasant, coooperative.    Labs on Admission: I have personally reviewed following labs and imaging studies  CBC: Recent Labs  Lab 04/14/19 2037 04/15/19 0525  WBC 8.1 7.0  NEUTROABS 6.0 4.7  HGB 12.7 11.8*  HCT 38.7 36.0  MCV 89.6 89.1  PLT 231 700   Basic Metabolic Panel: Recent Labs  Lab 04/14/19 2037 04/15/19 0525  NA 134* 137  K 3.9 3.6  CL 106 106  CO2 19* 23  GLUCOSE 110* 106*  BUN 11 13  CREATININE 0.63 0.60  CALCIUM 8.3* 8.4*   GFR: Estimated Creatinine Clearance: 63.9 mL/min (by C-G formula based on SCr of 0.6 mg/dL). Liver Function Tests: Recent Labs  Lab 04/14/19 2037 04/15/19 0525  AST 24 23  ALT 26 24  ALKPHOS 74 74  BILITOT 0.9 0.6  PROT 6.5 6.6  ALBUMIN 3.5 3.5   No results for input(s): LIPASE, AMYLASE in the last 168 hours. No results for input(s): AMMONIA in the last 168 hours. Coagulation Profile: No results for input(s): INR, PROTIME in the last 168 hours. Cardiac Enzymes: No results for input(s): CKTOTAL, CKMB, CKMBINDEX, TROPONINI in the last 168 hours. BNP (last 3 results) No results for input(s): PROBNP in the last 8760 hours. HbA1C: No results for input(s): HGBA1C in the last 72 hours. CBG: No results for input(s): GLUCAP in the last 168 hours. Lipid Profile: Recent Labs    04/14/19 2037  TRIG 135   Thyroid Function Tests: No results for input(s): TSH, T4TOTAL, FREET4, T3FREE, THYROIDAB in the last 72 hours. Anemia Panel: Recent Labs    04/14/19 2037  FERRITIN 275   Urine analysis: No results found for: COLORURINE, APPEARANCEUR, LABSPEC, PHURINE, GLUCOSEU, HGBUR, BILIRUBINUR, KETONESUR, PROTEINUR, UROBILINOGEN, NITRITE, LEUKOCYTESUR  Radiological Exams on Admission: Dg Chest Port 1 View  Result Date: 04/14/2019 CLINICAL DATA:  Dyspnea, COVID-19 positive EXAM: PORTABLE  CHEST 1 VIEW COMPARISON:  None. FINDINGS: Normal heart size. Normal mediastinal contour. No pneumothorax. No convincing pleural effusion. Extensive patchy opacities throughout the periphery of both lungs, most prominent in the peripheral upper left lung. IMPRESSION: Extensive patchy opacities throughout the periphery of both lungs, compatible with COVID-19 pneumonia. Electronically Signed   By: Ilona Sorrel M.D.   On: 04/14/2019 20:52    EKG: Independently reviewed. Sinus rhythm, QTc 428 ms.   Little Ishikawa, MD [facility] Pager [phone]  If 7PM-7AM, please contact night-coverage [email or web address] Password [email or web address]  04/15/2019, 10:22 AM

## 2019-04-15 NOTE — Progress Notes (Signed)
This afternoon patient sats on room air 92-94% at rest. Sats 87-88% on room air when ambulating, she also experienced some SOB with ambulation, she seemed to recover well after a few minutes walking. MD Harlow Asa updated about condition. Will continue to monitor closely.

## 2019-04-16 DIAGNOSIS — J1289 Other viral pneumonia: Secondary | ICD-10-CM | POA: Diagnosis present

## 2019-04-16 DIAGNOSIS — U071 COVID-19: Secondary | ICD-10-CM | POA: Diagnosis present

## 2019-04-16 DIAGNOSIS — J9601 Acute respiratory failure with hypoxia: Secondary | ICD-10-CM | POA: Diagnosis present

## 2019-04-16 DIAGNOSIS — I10 Essential (primary) hypertension: Secondary | ICD-10-CM | POA: Diagnosis present

## 2019-04-16 DIAGNOSIS — E871 Hypo-osmolality and hyponatremia: Secondary | ICD-10-CM | POA: Diagnosis present

## 2019-04-16 LAB — COMPREHENSIVE METABOLIC PANEL
ALT: 33 U/L (ref 0–44)
AST: 24 U/L (ref 15–41)
Albumin: 3.5 g/dL (ref 3.5–5.0)
Alkaline Phosphatase: 80 U/L (ref 38–126)
Anion gap: 6 (ref 5–15)
BUN: 13 mg/dL (ref 8–23)
CO2: 24 mmol/L (ref 22–32)
Calcium: 8.6 mg/dL — ABNORMAL LOW (ref 8.9–10.3)
Chloride: 108 mmol/L (ref 98–111)
Creatinine, Ser: 0.66 mg/dL (ref 0.44–1.00)
GFR calc Af Amer: >60 mL/min (ref >60)
GFR calc non Af Amer: >60 mL/min (ref >60)
Glucose, Bld: 89 mg/dL (ref 70–99)
Potassium: 4 mmol/L (ref 3.5–5.1)
Sodium: 138 mmol/L (ref 135–145)
Total Bilirubin: 0.5 mg/dL (ref 0.3–1.2)
Total Protein: 6.5 g/dL (ref 6.5–8.1)

## 2019-04-16 LAB — CBC WITH DIFFERENTIAL/PLATELET
Abs Immature Granulocytes: 0.06 10*3/uL (ref 0.00–0.07)
Basophils Absolute: 0 10*3/uL (ref 0.0–0.1)
Basophils Relative: 1 %
Eosinophils Absolute: 0.1 10*3/uL (ref 0.0–0.5)
Eosinophils Relative: 1 %
HCT: 36.7 % (ref 36.0–46.0)
Hemoglobin: 11.8 g/dL — ABNORMAL LOW (ref 12.0–15.0)
Immature Granulocytes: 1 %
Lymphocytes Relative: 31 %
Lymphs Abs: 1.9 10*3/uL (ref 0.7–4.0)
MCH: 28.9 pg (ref 26.0–34.0)
MCHC: 32.2 g/dL (ref 30.0–36.0)
MCV: 90 fL (ref 80.0–100.0)
Monocytes Absolute: 0.6 10*3/uL (ref 0.1–1.0)
Monocytes Relative: 10 %
Neutro Abs: 3.4 10*3/uL (ref 1.7–7.7)
Neutrophils Relative %: 56 %
Platelets: 287 10*3/uL (ref 150–400)
RBC: 4.08 MIL/uL (ref 3.87–5.11)
RDW: 12.6 % (ref 11.5–15.5)
WBC: 6 10*3/uL (ref 4.0–10.5)
nRBC: 0 % (ref 0.0–0.2)

## 2019-04-16 LAB — C-REACTIVE PROTEIN: CRP: 5.7 mg/dL — ABNORMAL HIGH (ref <1.0)

## 2019-04-16 LAB — D-DIMER, QUANTITATIVE: D-Dimer, Quant: 0.43 ug/mL-FEU (ref 0.00–0.50)

## 2019-04-16 MED ORDER — METHYLPREDNISOLONE SODIUM SUCC 40 MG IJ SOLR
40.0000 mg | Freq: Two times a day (BID) | INTRAMUSCULAR | Status: DC
  Administered 2019-04-16 – 2019-04-17 (×3): 40 mg via INTRAVENOUS
  Filled 2019-04-16 (×3): qty 1

## 2019-04-16 NOTE — Progress Notes (Addendum)
Progress note   Courtney Richard JAS:[id number] DOB: 1951/08/07 DOA: 04/14/2019  PCP: Hermine Messick, MD   Patient coming from: Home   Chief Complaint: Cough, SOB, headache, anorexia, malaise   HPI: Courtney Richard is a 68 y.o. female who denies any significant past medical history, now presenting to the emergency department with progressive shortness of breath, cough, anorexia, and malaise after being diagnosed with COVID-19 on 04/09/2019.  Patient reports that multiple coworkers had been diagnosed with COVID-19, she developed a cough, exertional dyspnea, impaired taste and smell, and general malaise about a week ago, was tested for the novel coronavirus on 04/09/2019 and this returned positive.  Since that time, she has had progressive dyspnea and malaise.  She is now short of breath at rest, much worse with mild exertion. Upon arrival to the ED, patient is found to have a temperature of 37.7 C, saturating mid 90s, tachycardic to the 30s, and with stable blood pressure.  EKG features a sinus rhythm and chest x-ray reveals extensive patchy opacity throughout the periphery of both lungs consistent with COVID pneumonia.  Chemistry panel is notable for slight hyponatremia and low bicarbonate.  CBC is unremarkable.  Lactic acid is normal.  Procalcitonin undetectable.  Age-adjusted d-dimer is negative.  Blood cultures were collected in the ED.  Patient attempted to ambulate a short distance in her ED room, maintain saturations in the 90s, but quickly became tachypneic into the 30s and felt as though she was going to pass out.  Hospitalists are consulted for admission.  Assessment/Plan   Acute hypoxic respiratory failure 2/2 pneumonia/COVID-19, POA  - Presents with progressive SOB, anorexia, and malaise after developing sxs ~1 wk earlier and testing positive for COVID-19 on 04/09/19 (as seen in "care everywhere") - CXR features extensive patchy opacities peripherally of both lungs   -Patient now requiring supplemental oxygen with minimal ambulation - titrate as necessary; continue daily O2 ambulatory screens -Methylprednisolone initiated 7/6 -Patient does not yet meet criteria for remdesivir as she only requires O2 with ambulation  Recent Labs    04/14/19 2037 04/15/19 0525 04/16/19 0423  DDIMER 0.55* 0.43 0.43  FERRITIN 275  --   --   LDH 210*  --   --   CRP 8.4* 7.3* 5.7*    PPE: CAPR, gown, gloves. Patient wearing mask.  DVT prophylaxis: Lovenox  Code Status: Full  Family Communication: Discussed with patient   Consults called: None Admission status: inpatient  Subjective:  No acute issues or events overnight, continues to complain of general malaise. Respiratory status feels "about the same as yesterday." Declines chest pain, nausea, vomiting, diarrhea, constipation, headache, fevers, chills.  All other systems reviewed and apart from HPI, are negative.  Physical Exam: Vitals:   04/16/19 0457 04/16/19 0500 04/16/19 0600 04/16/19 0752  BP: 119/74 119/74  115/73  Pulse: 72 74 66 71  Resp: 16   16  Temp: 98.7 F (37.1 C)   98.2 F (36.8 C)  TempSrc: Oral   Oral  SpO2: 95% 94% 95% 94%  Weight:      Height:        Constitutional: Mildly dyspneic with speech, no pallor, no diaphoresis  Eyes: PERTLA, lids and conjunctivae normal ENMT: Mucous membranes are moist. Posterior pharynx clear of any exudate or lesions.   Neck: normal, supple, no masses, no thyromegaly Respiratory: Mild tachypnea and dyspneic at rest. Without accessory muscle use. Without over wheeze, rhonchi, or rales. Cardiovascular: S1 & S2 heard, regular rate and  rhythm. No extremity edema.  Abdomen: No distension, soft, mild generalized tenderness without rebound pain or guarding. Bowel sounds active.  Musculoskeletal: no clubbing / cyanosis. No joint deformity upper and lower extremities.    Skin: no significant rashes, lesions, ulcers. Warm, dry, well-perfused. Neurologic: CN 2-12  grossly intact. Sensation intact. Strength 5/5 in all 4 limbs.  Psychiatric: Alert and oriented x 3. Very pleasant, coooperative.   Labs on Admission: I have personally reviewed following labs and imaging studies  CBC: Recent Labs  Lab 04/14/19 2037 04/15/19 0525 04/16/19 0423  WBC 8.1 7.0 6.0  NEUTROABS 6.0 4.7 3.4  HGB 12.7 11.8* 11.8*  HCT 38.7 36.0 36.7  MCV 89.6 89.1 90.0  PLT 231 248 287   Basic Metabolic Panel: Recent Labs  Lab 04/14/19 2037 04/15/19 0525 04/16/19 0423  NA 134* 137 138  K 3.9 3.6 4.0  CL 106 106 108  CO2 19* 23 24  GLUCOSE 110* 106* 89  BUN 11 13 13   CREATININE 0.63 0.60 0.66  CALCIUM 8.3* 8.4* 8.6*   GFR: Estimated Creatinine Clearance: 63.9 mL/min (by C-G formula based on SCr of 0.66 mg/dL). Liver Function Tests: Recent Labs  Lab 04/14/19 2037 04/15/19 0525 04/16/19 0423  AST 24 23 24   ALT 26 24 33  ALKPHOS 74 74 80  BILITOT 0.9 0.6 0.5  PROT 6.5 6.6 6.5  ALBUMIN 3.5 3.5 3.5   No results for input(s): LIPASE, AMYLASE in the last 168 hours. No results for input(s): AMMONIA in the last 168 hours. Coagulation Profile: No results for input(s): INR, PROTIME in the last 168 hours. Cardiac Enzymes: No results for input(s): CKTOTAL, CKMB, CKMBINDEX, TROPONINI in the last 168 hours. BNP (last 3 results) No results for input(s): PROBNP in the last 8760 hours. HbA1C: No results for input(s): HGBA1C in the last 72 hours. CBG: No results for input(s): GLUCAP in the last 168 hours. Lipid Profile: Recent Labs    04/14/19 2037  TRIG 135   Thyroid Function Tests: No results for input(s): TSH, T4TOTAL, FREET4, T3FREE, THYROIDAB in the last 72 hours. Anemia Panel: Recent Labs    04/14/19 2037  FERRITIN 275   Urine analysis: No results found for: COLORURINE, APPEARANCEUR, LABSPEC, PHURINE, GLUCOSEU, HGBUR, BILIRUBINUR, KETONESUR, PROTEINUR, UROBILINOGEN, NITRITE, LEUKOCYTESUR  Radiological Exams on Admission: Dg Chest Port 1 View   Result Date: 04/14/2019 CLINICAL DATA:  Dyspnea, COVID-19 positive EXAM: PORTABLE CHEST 1 VIEW COMPARISON:  None. FINDINGS: Normal heart size. Normal mediastinal contour. No pneumothorax. No convincing pleural effusion. Extensive patchy opacities throughout the periphery of both lungs, most prominent in the peripheral upper left lung. IMPRESSION: Extensive patchy opacities throughout the periphery of both lungs, compatible with COVID-19 pneumonia. Electronically Signed   By: Delbert PhenixJason A Poff M.D.   On: 04/14/2019 20:52    Azucena FallenWilliam C Skyelar Halliday, MD [facility] Pager [phone]  If 7PM-7AM, please contact night-coverage [email or web address] Password [email or web address]  04/16/2019, 10:59 AM

## 2019-04-17 LAB — CBC WITH DIFFERENTIAL/PLATELET
Abs Immature Granulocytes: 0.07 10*3/uL (ref 0.00–0.07)
Basophils Absolute: 0 10*3/uL (ref 0.0–0.1)
Basophils Relative: 0 %
Eosinophils Absolute: 0 10*3/uL (ref 0.0–0.5)
Eosinophils Relative: 0 %
HCT: 36.8 % (ref 36.0–46.0)
Hemoglobin: 12 g/dL (ref 12.0–15.0)
Immature Granulocytes: 1 %
Lymphocytes Relative: 14 %
Lymphs Abs: 1.1 10*3/uL (ref 0.7–4.0)
MCH: 29 pg (ref 26.0–34.0)
MCHC: 32.6 g/dL (ref 30.0–36.0)
MCV: 88.9 fL (ref 80.0–100.0)
Monocytes Absolute: 0.4 10*3/uL (ref 0.1–1.0)
Monocytes Relative: 5 %
Neutro Abs: 6.3 10*3/uL (ref 1.7–7.7)
Neutrophils Relative %: 80 %
Platelets: 347 10*3/uL (ref 150–400)
RBC: 4.14 MIL/uL (ref 3.87–5.11)
RDW: 12.1 % (ref 11.5–15.5)
WBC: 7.8 10*3/uL (ref 4.0–10.5)
nRBC: 0 % (ref 0.0–0.2)

## 2019-04-17 LAB — COMPREHENSIVE METABOLIC PANEL
ALT: 43 U/L (ref 0–44)
AST: 26 U/L (ref 15–41)
Albumin: 3.6 g/dL (ref 3.5–5.0)
Alkaline Phosphatase: 87 U/L (ref 38–126)
Anion gap: 8 (ref 5–15)
BUN: 18 mg/dL (ref 8–23)
CO2: 22 mmol/L (ref 22–32)
Calcium: 9.1 mg/dL (ref 8.9–10.3)
Chloride: 109 mmol/L (ref 98–111)
Creatinine, Ser: 0.6 mg/dL (ref 0.44–1.00)
GFR calc Af Amer: >60 mL/min (ref >60)
GFR calc non Af Amer: >60 mL/min (ref >60)
Glucose, Bld: 158 mg/dL — ABNORMAL HIGH (ref 70–99)
Potassium: 4.3 mmol/L (ref 3.5–5.1)
Sodium: 139 mmol/L (ref 135–145)
Total Bilirubin: 0.3 mg/dL (ref 0.3–1.2)
Total Protein: 6.8 g/dL (ref 6.5–8.1)

## 2019-04-17 LAB — D-DIMER, QUANTITATIVE: D-Dimer, Quant: 0.35 ug/mL-FEU (ref 0.00–0.50)

## 2019-04-17 LAB — C-REACTIVE PROTEIN: CRP: 3 mg/dL — ABNORMAL HIGH (ref <1.0)

## 2019-04-17 MED ORDER — PREDNISONE 10 MG PO TABS: ORAL_TABLET | ORAL | 0 refills | Status: AC

## 2019-04-17 NOTE — Discharge Instructions (Signed)
COVID-19: Cómo protegerse y proteger a los demás °COVID-19: How to Protect Yourself and Others °Sepa cómo se propaga °· Actualmente, no existe ninguna vacuna para prevenir la enfermedad por coronavirus 2019 (COVID-19). °· La mejor forma de prevenir la enfermedad es evitar exponerse a este virus. °· Se cree que el virus se transmite principalmente de una persona a otra. °? Entre las personas que están en contacto directo entre sí (a una distancia inferior a 6 pies [1.80 m]). °? A través de las gotitas respiratorias producidas cuando una persona infectada tose, estornuda o habla. °? Estas gotitas pueden caer en la boca o en la nariz de las personas que están cerca o pueden ser inhaladas hacia los pulmones. °? Algunos estudios recientes sugieren que la COVID-19 puede ser transmitida por personas que no presentan síntomas. °Lo que todos deben hacer °Límpiese las manos con frecuencia °· Lávese las manos con frecuencia con agua y jabón durante al menos 20 segundos, especialmente después de haber estado en un lugar público o después de sonarse la nariz, toser o estornudar. °· Si no dispone de agua y jabón, use un desinfectante de manos que contenga al menos un 60 % de alcohol. Cubra todas las superficies de las manos y frótelas hasta que se sientan secas. °· No se toque los ojos, la nariz y la boca sin antes lavarse las manos. °Evite el contacto cercano °· Quédese en casa si está enfermo. °· Evite el contacto cercano con personas que estén enfermas. °· Establezca distancia entre usted y otras personas. °? Recuerde que algunas personas que no tienen síntomas pueden transmitir el virus. °? Esto es especialmente importante para las personas que tienen más riesgo de enfermarse.www.cdc.gov/coronavirus/2019-ncov/need-extra-precautions/people-at-higher-risk.html °Cúbrase la boca y la nariz con un barbijo de tela cuando esté cerca de otras personas °· Puede transmitir la COVID-19 a otras personas aunque no se sienta  enfermo. °· Todas las personas deben usar un barbijo de tela cuando tengan que ir a un lugar público, por ejemplo, al supermercado o a buscar otros productos necesarios. °? Los barbijos de tela no deben colocarse a niños menores de 2 años de edad, a las personas que tienen problemas respiratorios o que estén inconscientes, incapacitadas o que por algún motivo no puedan quitarse la mascarilla sin ayuda. °· El propósito del barbijo de tela es proteger a otras personas en caso de que usted esté infectado. °· NO utilice las mascarillas destinadas a los trabajadores de la salud. °· Continúe manteniendo una distancia aproximada de 6 pies (1.80 m) entre usted y otras personas. El barbijo de tela no reemplaza el distanciamiento social. °Cúbrase al toser y estornudar °· Si está en un ambiente privado y no tiene el barbijo de tela, recuerde siempre cubrirse la boca y la nariz con un pañuelo descartable al toser o estornudar, o usar el pliegue del codo. °· Deseche los pañuelos descartables usados en la basura. °· Inmediatamente, lávese las manos con agua y jabón durante al menos 20 segundos. Si no dispone de agua y jabón, límpiese las manos con un desinfectante de manos que contenga al menos un 60 % de alcohol. °Limpie y desinfecte °· Limpie Y desinfecte las superficies que se tocan con frecuencia todos los días. Esto incluye mesas, picaportes, interruptores de luz, encimeras, mangos, escritorios, teléfonos, teclados, inodoros, grifos y lavabos. www.cdc.gov/coronavirus/2019-ncov/prevent-getting-sick/disinfecting-your-home.html °· Si las superficies están sucias, límpielas: Use detergente o jabón y agua antes de la desinfección. °· Luego, use un desinfectante doméstico. Puede consultar una lista de los desinfectantes domésticos registrados en la Environmental Protection Agency (EPA) (  Agencia de Proteccin Ambiental) aqu. SouthAmericaFlowers.co.ukcdc.gov/coronavirus 02/13/2019 Esta informacin no tiene Theme park managercomo fin reemplazar el consejo del mdico.  Asegrese de hacerle al mdico cualquier pregunta que tenga. Document Released: 02/01/2019 Document Revised: 02/27/2019 Document Reviewed: 01/23/2019 Elsevier Patient Education  2020 ArvinMeritorElsevier Inc.   COVID-19 COVID-19 La COVID-19 es una infeccin respiratoria causada por un virus llamado coronavirus tipo 2 causante del sndrome respiratorio agudo grave (SARS-CoV-2). La enfermedad tambin se conoce como enfermedad por coronavirus o nuevo coronavirus. En algunas personas, el virus puede no ocasionar sntomas. En otras, puede producir una infeccin grave. La infeccin puede empeorar rpidamente y causar complicaciones, como:  Neumona o infeccin en los pulmones.  Sndrome de dificultad respiratoria aguda o SDRA. Se trata de la acumulacin de lquido en los pulmones.  Insuficiencia respiratoria aguda. Se trata de una afeccin en la que no pasa suficiente oxgeno de los pulmones al cuerpo.  Sepsis o choque sptico. Se trata de una reaccin grave del cuerpo ante una infeccin.  Problemas de coagulacin.  Infecciones secundarias debido a bacterias u hongos. El virus que causa la COVID-19 es contagioso. Esto significa que puede transmitirse de [place] persona a otra a travs de las gotitas de saliva de la tos y de los estornudos (secreciones respiratorias). Cules son las causas? Esta enfermedad es causada por un virus. Usted puede contagiarse con este virus:  Al aspirar las gotitas que una persona infectada elimina al toser o Engineering geologistestornudar.  Al tocar algo, como una mesa o el picaportes de Donovanuna puerta, que estuvo expuesto al virus (contaminado) y luego tocarse la boca, nariz o los ojos. Qu incrementa el riesgo? Riesgo de infeccin Es ms probable que se infecte con este virus si:  Vive o viaja a una zona donde hay un brote de COVID-19.  Carollee MassedEntra en contacto con una persona enferma que recientemente viaj a una zona con un brote de COVID-19.  Cuida o vive con una persona infectada con  COVID-19. Riesgo de enfermedad grave Es ms probable que se enferme gravemente por el virus si:  Tiene 66aos o ms.  Tiene una enfermedad crnica que disminuye la capacidad del cuerpo para combatir las infecciones (immunocomprometido).  Vive en un hogar de ancianos o centro de atencin a Air cabin crewlargo plazo.  Tiene una enfermedad prolongada (crnica), como las siguientes: ? Enfermedad pulmonar crnica, que incluye la enfermedad pulmonar obstructiva crnica o asma. ? Enfermedad cardaca. ? Diabetes. ? Enfermedad renal crnica. ? Enfermedad heptica.  Es obeso. Cules son los signos o sntomas? Los sntomas de esta afeccin pueden ser de leves a graves. Los sntomas pueden aparecer en el trmino de 2 a [address] das despus de haber estado expuesto al virus. Incluyen los siguientes:  [place]Fiebre.  Tos.  Dificultad para respirar.  Escalofros.  Dolores musculares.  Dolor de Advertising copywritergarganta.  Prdida del gusto o Cabin crewel olfato. Algunas personas tambin pueden Matteltener problemas estomacales, como nuseas, vmitos o diarrea. Es posible que otras personas no tengan sntomas de COVID-19. Cmo se diagnostica? Esta afeccin se puede diagnosticar en funcin de lo siguiente:  Sus signos y sntomas, especialmente si: ? Vive en una zona donde hay un brote de COVID-19. ? Viaj recientemente a una zona donde el virus es frecuente. ? Cuida o vive con Neomia Dearuna persona a quien se le diagnostic COVID-19.  Un examen fsico.  Anlisis de laboratorio que pueden incluir: ? Un hisopado nasal para tomar Colombiauna muestra de lquido de la nariz. ? Un hisopado de garganta para tomar Lauris Poaguna muestra de lquido de la garganta. ? Neomia DearUna  muestra de mucosidad de los pulmones (esputo). ? Anlisis de [place].  Los estudios de diagnstico por imgenes pueden incluir radiografas, exploracin por tomografa computarizada (TC) o ecografa. Cmo se trata? En este momento, no hay ningn medicamento para tratar la COVID-19. Los medicamentos para  tratar otras enfermedades se usan a modo de ensayo para comprobar si son eficaces contra la COVID-19. El mdico le informar sobre las maneras de tratar los sntomas. En la [employer], la infeccin es leve y puede controlarse en el hogar con reposo, lquidos y medicamentos de [place]. El tratamiento para una infeccin grave suele realizarse en la unidad de cuidados intensivos (UCI) de un hospital. Puede incluir uno o ms de los siguientes. Estos tratamientos se administran hasta que los sntomas mejoran.  Recibir lquidos y [employer] a travs de una va intravenosa.  Oxgeno complementario. Para administrar oxgeno extra, se [place] un tubo en la Darene Lamer, una mascarilla o una campana de oxgeno.  Colocarlo para que se recueste boca abajo (decbito prono). Esto facilita el ingreso de oxgeno a los pulmones.  Uso continuo de [place] de presin positiva de las vas areas (CPAP) o de presin positiva de las vas areas de dos niveles (BPAP). Este tratamiento utiliza una presin de aire leve para Pharmacologist las vas respiratorias abiertas. Un tubo conectado a un motor administra oxgeno al cuerpo.  Respirador. Este tratamiento mueve el aire dentro y fuera de los pulmones mediante el uso de un tubo que se coloca en la trquea.  Traqueostoma. En este procedimiento se hace un orificio en el cuello para insertar un tubo de respiracin.  Oxigenacin por membrana extracorprea (OMEC). En este procedimiento, los pulmones tienen la posibilidad de recuperarse al asumir las funciones del corazn y los pulmones. Suministra oxgeno al cuerpo y elimina el dixido de carbono. Siga estas instrucciones en su casa: Estilo de vida  Si est enfermo, qudese en su casa, excepto para obtener atencin mdica. El mdico le indicar cunto tiempo debe quedarse en casa. Llame al mdico antes de buscar atencin mdica.  Haga reposo en su casa como se lo haya indicado el mdico.  No consuma ningn  producto que contenga nicotina o tabaco, como cigarrillos, cigarrillos electrnicos y tabaco de Theatre manager. Si necesita ayuda para dejar de fumar, consulte al mdico.  Retome sus actividades normales como se lo haya indicado el mdico. Pregntele al mdico qu actividades son seguras para usted. Instrucciones generales  Use los medicamentos de venta libre y los recetados solamente como se lo haya indicado el mdico.  Beba suficiente lquido como para Pharmacologist la orina de color amarillo plido.  Concurra a todas las visitas de [address] se lo haya indicado el mdico. Esto es importante. Cmo se evita?  No hay ninguna vacuna que ayude a prevenir la infeccin por la COVID-19. Sin embargo, hay medidas que puede tomar para protegerse y Conservator, museum/gallery a Economist de este virus. Para protegerse:   No viaje a zonas donde la COVID-19 sea un riesgo. Las zonas donde se informa la presencia de la COVID-19 [place] con frecuencia. Para identificar las zonas de alto riesgo y las restricciones de viaje, consulte el sitio web de viajes de Building control surveyor for [employer] Technology and Prevention Insurance claims handler) (Centros para el Control y la Prevencin de Event organiser): [email or web address].si  Si vive o debe viajar a una zona donde COVID-19 es un riesgo, tome precauciones para evitar infecciones. ? Aljese de [employer]. ? Lvese las manos frecuentemente con agua y [place].  Use desinfectante para manos con alcohol si no dispone de [place] y [place]. ? Evite tocarse la boca, la cara, los ojos o la [place]. ? Evite salir de su casa, siga las indicaciones de su estado y de las autoridades sanitarias locales. ? Si debe salir de su casa, use un barbijo de tela o una mascarilla facial. ? Desinfecte los objetos y las superficies que se tocan con frecuencia todos [place]. Pueden incluir:  Encimeras y [place].  Picaportes e interruptores de luz.  Lavabos, fregaderos y grifos.  Aparatos electrnicos  tales como telfonos, controles remotos, teclados, computadoras y tabletas. Cmo proteger a los dems: Si tiene sntomas de la COVID-19, tome medidas para evitar que el virus se propague a Economist.  Si cree que tiene una infeccin por la COVID-19, comunquese de inmediato con su mdico. Informe al equipo de atencin mdica que cree que puede tener una infeccin por la COVID-19.  Qudese en su casa. Salga de su casa solo para buscar atencin mdica. No utilice el transporte pblico.  No viaje mientras est enfermo.  Lvese las manos frecuentemente con agua y [place]. Usar desinfectante para manos con alcohol si no dispone de [place] y [place].  Mantngase alejado de quienes vivan con usted. Permita que los miembros de la familia sanos cuiden a los nios y las [place], si es posible. Si tiene que cuidar a los nios o las mascotas, lvese las manos con frecuencia y use un barbijo. Si es posible, permanezca en su habitacin, separado de los dems. Utilice un bao diferente.  Asegrese de que todas las personas que viven en su casa se laven bien las manos y con frecuencia.  Tosa o estornude en un pauelo de papel o sobre su manga o codo. No tosa o estornude al aire ni se cubra la boca o la nariz con la [place].  Use un barbijo de tela o una mascarilla facial. Dnde buscar ms informacin  Centers for Disease Control and Prevention (Centros para el Control y la Prevencin de Event organiser): StickerEmporium.tn  World Health Organization (Organizacin Mundial de la Salud): https://thompson-craig.com/ Comunquese con un mdico si:  Vive o ha viajado a una zona donde la COVID-19 es un riesgo y tiene sntomas de infeccin.  Ha tenido contacto con alguien que tiene COVID-19 y usted tiene sntomas de infeccin. Solicite ayuda de inmediato si:  Tiene dificultad para respirar.  Siente dolor u opresin en el pecho.  Experimenta  confusin.  Tiene las uas de los dedos y los labios de color [place].  Tiene dificultad para despertarse.  Los sntomas empeoran. Estos sntomas pueden representar un problema grave que constituye Radio broadcast assistant. No espere a ver si los sntomas desaparecen. Solicite atencin mdica de inmediato. Comunquese con el servicio de emergencias de su localidad (911 en los [place]). No conduzca por sus propios medios [employer] hospital. Informe al personal mdico de emergencias si cree que tiene COVID-19. Resumen  La COVID-19 es una infeccin respiratoria causada por un virus. Tambin se conoce como enfermedad por coronavirus o nuevo coronavirus. Puede causar infecciones graves, como neumona, sndrome de dificultad respiratoria aguda, insuficiencia respiratoria aguda o sepsis.  El virus que causa la COVID-19 es contagioso. Esto significa que puede transmitirse de [place] persona a otra a travs de las gotitas de saliva de la tos y de los estornudos.  Es ms probable que desarrolle una enfermedad grave si tiene 65 aos o ms, tiene un sistema inmunitario dbil, vive en un hogar de ancianos o  tiene enfermedad crnica.  No hay ningn medicamento para tratar la COVID-19. El mdico le informar sobre las maneras de tratar los sntomas.  Tome medidas para protegerse y Conservator, museum/galleryproteger a los Merchandiser, retaildems contra las infecciones. Lvese las manos con frecuencia y desinfecte los objetos y las superficies que se tocan con frecuencia todos [place] das. Mantngase alejado de las personas que estn enfermas y use un barbijo si est enfermo. Esta informacin no tiene Theme park managercomo fin reemplazar el consejo del mdico. Asegrese de hacerle al mdico cualquier pregunta que tenga. Document Released: 11/25/2018 Document Revised: 02/27/2019 Document Reviewed: 11/25/2018 Elsevier Patient Education  2020 Elsevier Inc.   Preguntas frecuentes sobre el COVID-19 COVID-19 Frequently Asked Questions El COVID-19 (enfermedad por coronavirus) es una  infeccin causada por una gran familia de virus. Algunos virus causan National Cityenfermedades en las personas y otros causan enfermedades en animales tales como los camellos, los gatos y los murcilagos. En algunos casos, los virus que causan New York Life Insuranceenfermedades en los animales pueden transmitirse a los seres humanos. De dnde provino el coronavirus? En diciembre de 2019, Armeniahina le inform a la Organizacin Mundial de la Salud (OMS) acerca de varios casos de enfermedad pulmonar (enfermedad respiratoria humana). Estos casos estaban vinculados con un mercado abierto de frutos de mar y Germanyganado en la ciudad de [place]. El vnculo con el mercado de ganado y Liberty Globalmariscos sugiere que el virus puede haberse propagado de los animales a los Mileshumanos. Sin embargo, desde Chiropodistese primer brote en diciembre, tambin se ha demostrado que el virus se contagia de Bangor Baseuna persona a Educational psychologistotra. Cul es el nombre de la enfermedad y del virus? Nombre de la enfermedad Al principio, esta enfermedad se llam nuevo coronavirus. Esto se debe a que los cientficos determinaron que la enfermedad era causada por un nuevo virus respiratorio. La Organizacin Mundial de la Salud (OMS) ahora ha dado a la enfermedad el nombre de COVID-19, o enfermedad por coronavirus. Nombre del virus El virus causante de la enfermedad se conoce como coronavirus de tipo 2 causante del sndrome respiratorio agudo grave (SARS-CoV-2). Ms informacin sobre el nombre de la enfermedad y el virus Organizacin Mundial de la AlderpointSalud (OMS): www.who.int/emergencies/diseases/novel-coronavirus-2019/technical-guidance/naming-the-coronavirus-disease-(covid-2019)-and-the-virus-that-causes-it Quines estn en riesgo de sufrir complicaciones debido a la enfermedad por coronavirus? Algunas personas pueden tener un riesgo ms alto de tener complicaciones debido a la enfermedad por coronavirus. Entre ellas se encuentran los ONEOKadultos mayores y las personas que tienen enfermedades crnicas, como enfermedad cardaca,  diabetes y enfermedad pulmonar. Si tiene un riesgo ms alto de Sales executivetener complicaciones, tome estas precauciones adicionales:  Recruitment consultantvitar el contacto cercano con personas que estn enfermas o que tengan fiebre o tos. Permanecer al menos a una distancia de 3 a 6 pies (1-329m) de las Nucor Corporationotras personas, si es posible.  Lavarse las manos regularmente con agua y jabn durante al menos 20segundos.  Evitar tocarse la cara, la boca, la nariz y los ojos.  Tener a H. J. Heinzmano los suministros en su casa, como alimentos, medicamentos y productos de limpieza.  Permanecer en su casa todo lo que sea posible.  Evitar las reuniones sociales y los viajes. Cmo se transmite la enfermedad causada por el coronavirus? El virus que causa la enfermedad por coronavirus se transmite fcilmente de Neomia Dearuna persona a otra (es contagioso). Tambin hay casos de enfermedad de transmisin comunitaria. Esto significa que la enfermedad se ha propagado a:  Personas que no tienen contacto conocido con Pharmacist, communityotras personas infectadas.  Personas que no han viajado a zonas donde hay casos conocidos. Aparentemente, se transmite de [place]a persona  a otra a travs de las YUM! Brandsgotitas que se despiden al toser o al estornudar. Puedo contraer al virus al tocar superficies u objetos? Todava hay mucho que no se conoce acerca del virus que causa la enfermedad por coronavirus. Los cientficos basan gran parte de la informacin en lo que saben sobre virus similares, por ejemplo:  En general, los virus no sobreviven en superficies durante mucho tiempo. Necesitan un cuerpo humano (husped) para sobrevivir.  Es ms probable que el virus se contagie por contacto cercano con personas que estn enfermas (contacto directo), por ejemplo: ? Al estrechar las manos o abrazarse. ? Al inhalar las gotitas respiratorias que se desplazan por el aire. Esto puede ocurrir cuando una persona infectada tose o estornuda sobre o cerca de Economistotras personas.  Es menos probable que el virus se  propague cuando una persona toca una superficie o un objeto sobre el que est el virus (contacto indirecto). El virus puede ingresar al cuerpo si la persona toca una superficie o un objeto y Express Scriptsluego se toca la cara, los ojos, la nariz o la boca. Una persona puede contagiar el virus sin tener sntomas de la enfermedad? Puede ser posible que el virus se contagie antes de que la persona tenga sntomas de la enfermedad, pero muy probablemente esta no sea la principal forma en que el virus se est propagando. Es ms probable que el virus se propague al estar en contacto directo con personas que estn enfermas e inhalar las gotas respiratorias que una persona enferma despide al toser o Engineering geologistestornudar. Cules son los sntomas de la enfermedad causada por el coronavirus? Los sntomas varan de Neomia Dearuna persona a otra y pueden variar de leves a graves. Hershey CompanyEntre los sntomas, se pueden incluir los siguientes:  [place]iebre.  Tos.  Cansancio, debilidad o fatiga.  Respiracin rpida o sensacin de falta el aire. Estos sntomas pueden aparecer en el trmino de 2 a [address] das despus de haber estado expuesto al virus. Si presenta sntomas, llame al mdico. Las personas con sntomas graves pueden necesitar atencin hospitalaria. Si estoy expuesto al virus, cunto tiempo tardan en aparecer los sntomas? Los sntomas de la enfermedad por coronavirus Magazine features editorpueden aparecer en cualquier momento en el trmino de 2 a 14 das despus de que una persona haya estado expuesta al virus. Si presenta sntomas, llame al mdico. Debo hacerme un anlisis de deteccin del virus? El mdico decidir si debe realizarse un anlisis en funcin de sus sntomas, antecedentes de exposicin y factores de Sundanceriesgo. Cmo realiza el mdico el anlisis para detectar este virus? Los mdicos obtienen muestras para enviar a Chiropractoranalizar. Estas muestras pueden incluir lo siguiente:  Tomar con un hisopo lquido de Architectural technologistla nariz.  Pedirle que tosa mucosidad (esputo) para extraer  lquido de los pulmones en un recipiente estril.  Tomar una muestra de Foxworthsangre.  Tomar una Luxembourgmuestra de heces u Comorosorina. Hay algn tratamiento o vacuna para este virus? Actualmente, no existe ninguna vacuna para prevenir la enfermedad por coronavirus. Adems, no existen Colgate Palmolivemedicamentos como los antibiticos o los antivirales para tratar el virus. Una persona que se enferma recibe tratamiento de apoyo, lo que significa reposo y lquidos. Una persona tambin puede aliviar sus sntomas con medicamentos de venta libre para tratar los estornudos, la tos y el goteo nasal. Son los mismos medicamentos que se toman para el resfro comn. Si presenta sntomas, llame al mdico. Las personas con sntomas graves pueden necesitar atencin hospitalaria. Qu puedo hacer para protegerme y proteger a mi familia de este virus?  Puede protegerse y proteger a su familia tomando las mismas medidas que tomara para prevenir el contagio de otros virus. Occidental Petroleum las siguientes medidas:  Lavarse las manos regularmente con agua y [place] durante al menos 20segundos. Usar desinfectante para manos con alcohol si no dispone de [place] y [place].  Evitar tocarse la cara, la boca, la nariz y los ojos.  Toser o estornudar en un pauelo descartable, sobre su manga o codo. No toser o estornudar al aire ni cubrirse con la [place]. ? Si tose o estornuda en un pauelo de papel, deschelo inmediatamente y [employer].  Desinfectar los Winn-Dixie y las superficies que se tocan con frecuencia todos [place].  Evitar el contacto cercano con personas que estn enfermas o que tengan fiebre o tos. Permanecer al menos a una distancia de 3 a 6 pies (1-41m) de las [employer], si es posible.  Jimmye Norman en su casa si est enfermo, excepto para obtener atencin mdica. Llame al mdico antes de buscar atencin mdica.  Asegrese de [place] vacunas al da. Pregntele al mdico qu vacunas necesita. Qu debo hacer si tengo que viajar? Siga las  recomendaciones relacionadas con los viajes de la autoridad de Arboriculturist, los CDC y Heritage manager. Informacin y consejos para Archivist for Disease Control and Prevention Librarian, academic) (Centros para el Control y la Prevencin de Arboriculturist): BodyEditor.hu  Organizacin [place] (OMS): ThirdIncome.ca [employer] riesgos y tome medidas para proteger su salud  El riesgo de Museum/gallery curator la enfermedad por coronavirus es ms alto si viaja a zonas con un brote o si est en contacto con viajeros que provienen de zonas donde hay un brote.  Lvese las manos con frecuencia y [place] higiene [place] para reducir el riesgo de contagiarse o transmitir el virus. Qu debo hacer si estoy enfermo? Instrucciones generales para detener la propagacin de la infeccin  Lavarse las manos regularmente con agua y jabn durante al menos 20segundos. Usar desinfectante para manos con alcohol si no dispone de [place] y [place].  Toser o estornudar en un pauelo descartable, sobre su manga o codo. No toser o estornudar al aire ni cubrirse con la [place].  Si tose o estornuda en un pauelo de papel, deschelo inmediatamente y [employer].  Foy [place] en su casa a menos que deba recibir [employer]. Llame al mdico o a la autoridad de salud local antes de buscar atencin mdica.  Evite las zonas pblicas. No viaje en transporte pblico, de ser posible.  Si puede, use un barbijo si debe salir de la casa o si est en contacto cercano con alguien que no est enfermo. Mantenga su casa limpia  Desinfecte los objetos y las superficies que se tocan con frecuencia todos [place]. Pueden incluir: ? Encimeras y [place]. ? Picaportes e interruptores de luz. ? Lavabos, fregaderos y grifos. ? Aparatos electrnicos tales como telfonos, controles remotos, teclados, computadoras y tabletas.  Lave los platos con agua jabonosa  caliente o en el lavavajillas. Deje los platos para que se sequen al aire.  Lave la ropa con agua caliente. Evite infectar a otros miembros de la familia  Permita que los miembros de la familia sanos cuiden a los nios y las [place], si es posible. Si tiene que cuidar a los nios o las mascotas, lvese las manos con frecuencia y use un barbijo.  Duerma en una habitacin o cama diferentes, si es posible.  No comparta elementos personales, como afeitadoras, cepillos de dientes, desodorantes, peines,  cepillos, toallas y [address] de [place]. Dnde buscar ms informacin Centers for Disease Control and Prevention (CDC)  Actualizaciones de informacin y novedades: CardRetirement.cz Organizacin Mundial de la Salud (OMS)  Actualizaciones de informacin y novedades: AffordableSalon.es  Tema de salud relacionado con el coronavirus: [email or web address]-[email or web address]/  Preguntas y Environmental health practitioner sobre COVID-19: [email or web address].com  Registro mundial: who.sprinklr.com American Academy of Pediatrics (AAP) (Academia Estadounidense de Pediatra)  Informacin para familias: www.healthychildren.org/English/health-issues/conditions/chest-lungs/Pages/2019-Novel-Coronavirus.aspx La situacin del coronavirus cambia rpidamente. Consulte el sitio web de su autoridad de Psychiatrist o los sitios web de los CDC y la OMS para enterarse de las novedades y noticias. Cundo debo comunicarme con un mdico?  Comunquese con su mdico si tiene sntomas de infeccin, como fiebre o tos, y: ? Arlean Hopping cerca de alguien que sabe que tiene la enfermedad por coronavirus. ? Arlean Hopping en contacto con una persona que presuntamente sufra de la enfermedad por coronavirus. ? Ha viajado fuera del pas. Cundo debo buscar asistencia mdica inmediata?  Busque ayuda de inmediato llamando al servicio de emergencias de su localidad (911 en los  Estados Unidos) si tiene lo siguiente: ? Dificultad para respirar. ? Dolor u opresin en el pecho. ? Confusin. ? Labios y uas de [employer]. ? Dificultad para despertarse. ? Sntomas que empeoran. Informe al personal mdico de emergencias si cree que tiene la enfermedad por coronavirus. Resumen  Un nuevo virus respiratorio se propaga de Neomia Dear persona a otra y causa COVID-19 (enfermedad por coronavirus).  El virus que causa el COVID-19 parece diseminarse fcilmente. Se transmite de [place] persona a otra a travs de las [employer] se despiden al toser o al estornudar.  Los [employer] y las personas que tienen enfermedades crnicas tienen mayor riesgo de Writer enfermedad. Si tiene un riesgo ms alto de tener complicaciones, tome Engineer, materials.  Actualmente, no existe ninguna vacuna para prevenir la enfermedad por coronavirus. No existen medicamentos, como los antibiticos o los antivirales, para tratar el virus.  Puede protegerse y proteger a su familia al lavarse las manos con frecuencia, evitar tocarse la cara y cubrirse al toser y Engineering geologist. Esta informacin no tiene Theme park manager el consejo del mdico. Asegrese de hacerle al mdico cualquier pregunta que tenga. Document Released: 02/05/2019 Document Revised: 02/05/2019 Document Reviewed: 02/05/2019 Elsevier Patient Education  2020 [employer].

## 2019-04-17 NOTE — Evaluation (Signed)
Physical Therapy Evaluation Patient Details Name: Courtney Richard MRN: [id number] DOB: 1951-09-25 Today's Date: 04/17/2019   History of Present Illness  Courtney IdlerRosa Marie Magdalena Dockendorf is a 68 y.o. female who denies any significant past medical history, now presenting to the emergency department with progressive shortness of breath, cough, anorexia, and malaise after being diagnosed with COVID-19 on 04/09/2019.  Clinical Impression  The patient ambulated x 440' on RA with SaO2 > 94 %. Patient reports very small SOB. No further PT indicated. Patient an ambulate independently. PT will sign off.    Follow Up Recommendations No PT follow up    Equipment Recommendations  None recommended by PT    Recommendations for Other Services       Precautions / Restrictions Precautions Precautions: None      Mobility  Bed Mobility Overal bed mobility: Independent                Transfers Overall transfer level: Independent                  Ambulation/Gait Ambulation/Gait assistance: Independent Gait Distance (Feet): 440 Feet Assistive device: None Gait Pattern/deviations: WFL(Within Functional Limits)     General Gait Details: SaO2 while ambulating >94%  Stairs            Wheelchair Mobility    Modified Rankin (Stroke Patients Only)       Balance                                             Pertinent Vitals/Pain      Home Living Family/patient expects to be discharged to:: Private residence Living Arrangements: Spouse/significant other;Other relatives Available Help at Discharge: Family Type of Home: House Home Access: Level entry     Home Layout: One level Home Equipment: None      Prior Function Level of Independence: Independent               Hand Dominance        Extremity/Trunk Assessment   Upper Extremity Assessment Upper Extremity Assessment: Overall WFL for tasks assessed    Lower Extremity  Assessment Lower Extremity Assessment: Overall WFL for tasks assessed    Cervical / Trunk Assessment Cervical / Trunk Assessment: Normal  Communication   Communication: No difficulties;Prefers language other than English(spanish but understands English quite well)  Cognition Arousal/Alertness: Awake/alert Behavior During Therapy: WFL for tasks assessed/performed Overall Cognitive Status: Within Functional Limits for tasks assessed                                        General Comments      Exercises     Assessment/Plan    PT Assessment Patent does not need any further PT services  PT Problem List         PT Treatment Interventions      PT Goals (Current goals can be found in the Care Plan section)  Acute Rehab PT Goals Patient Stated Goal: go home PT Goal Formulation: All assessment and education complete, DC therapy    Frequency     Barriers to discharge        Co-evaluation               AM-PAC PT "6 Clicks" Mobility  Outcome Measure Help needed turning from your back to your side while in a flat bed without using bedrails?: None Help needed moving from lying on your back to sitting on the side of a flat bed without using bedrails?: None Help needed moving to and from a bed to a chair (including a wheelchair)?: None Help needed standing up from a chair using your arms (e.g., wheelchair or bedside chair)?: None Help needed to walk in hospital room?: None Help needed climbing 3-5 steps with a railing? : None 6 Click Score: 24    End of Session   Activity Tolerance: Patient tolerated treatment well Patient left: in chair Nurse Communication: Mobility status PT Visit Diagnosis: Difficulty in walking, not elsewhere classified (R26.2)    Time: 0160-1093 PT Time Calculation (min) (ACUTE ONLY): 17 min   Charges:   PT Evaluation $PT Eval Low Complexity: 1 Low          [place] [phone] Office [phone]   Claretha Cooper 04/17/2019, 12:39 PM

## 2019-04-17 NOTE — Progress Notes (Signed)
Discharge instructions discussed with patient via [place] interpreter. Discussed new prescription and  instructed to follow up with PCP in 1 week.  Discussed COVID 19 isolation instructions.  All questions answered.  All belongings at patients bedside.  Pts husband to pick her up.

## 2019-04-17 NOTE — Discharge Summary (Signed)
Physician Discharge Summary  Courtney Richard ZOX:[id number] DOB: 1951-07-13 DOA: 04/14/2019  PCP: Harvie HeckJackson, Mishi, MD  Admit date: 04/14/2019 Discharge date: 04/17/2019  Admitted From: Home Disposition: Home  Recommendations for Outpatient Follow-up:  1. Follow up with PCP in 1-2 weeks 2. Please obtain BMP/CBC in one week  Discharge Condition: Stable CODE STATUS: Full Diet recommendation: As tolerated  Brief/Interim Summary: Courtney Richard Courtney Richard is a 68 y.o. female who denies any significant past medical history, now presenting to the emergency department with progressive shortness of breath, cough, anorexia, and malaise after being diagnosed with COVID-19 on 04/09/2019.  Patient reports that multiple coworkers had been diagnosed with COVID-19, she developed a cough, exertional dyspnea, impaired taste and smell, and general malaise about a week ago, was tested for the novel coronavirus on 04/09/2019 and this returned positive.  Since that time, she has had progressive dyspnea and malaise.  She is now short of breath at rest, much worse with mild exertion. Upon arrival to the ED, patient is found to have a temperature of 37.7 C, saturating mid 90s, tachycardic to the 30s, and with stable blood pressure.  EKG features a sinus rhythm and chest x-ray reveals extensive patchy opacity throughout the periphery of both lungs consistent with COVID pneumonia.  Chemistry panel is notable for slight hyponatremia and low bicarbonate.  CBC is unremarkable.  Lactic acid is normal.  Procalcitonin undetectable.  Age-adjusted d-dimer is negative.  Blood cultures were collected in the ED.  Patient attempted to ambulate a short distance in her ED room, maintain saturations in the 90s, but quickly became tachypneic into the 30s and felt as though she was going to pass out.  Hospitalists are consulted for admission.  She admitted as above with acute onset shortness of breath cough anorexia malaise,  previously diagnosed with COVID-19 on 04/09/2019, [facility] for further evaluation and treatment given her hypoxia with ambulation and progressively worsening shortness of breath.  Patient was given steroids, Remdesivir was held off given patient's non-hypoxic state at rest.  Patient drastically improved over the past 48 hours, now ambulating without hypoxia, stable and agreeable for discharge home.  Husband has also been diagnosed with COVID-19, however remains at home without symptoms.  The discussion at bedside today with wife as well as with husband over the phone about need for ongoing quarantine until further evaluation and release from their PCP.  Discharge Diagnoses:  Principal Problem:   Pneumonia due to COVID-19 virus  Acute hypoxic respiratory failure 2/2 pneumonia/COVID-19, POA  - Presents with progressive SOB, anorexia, and malaise after developing sxs ~1 wk earlier and testing positive for COVID-19 on 04/09/19 (as seen in "care everywhere") - CXR features extensive patchy opacities peripherally of both lungs  -Methylprednisolone initiated 7/6 -discharged on steroid taper above  PPE: CAPR, gown, gloves. Patient wearing mask.  DVT prophylaxis: Lovenox  Code Status: Full  Family Communication: Discussed with patient/husband Consults called: None Admission status:  Discharge patient home  Discharge Instructions Please continue to quarantine as discussed, continue all medications, complete steroid taper as prescribed.  Follow-up with PCP in 5 to 7 days for further evaluation treatment and further discussion about quarantine as indicated.  Allergies as of 04/17/2019   Not on File     Medication List    TAKE these medications   predniSONE 10 MG tablet Commonly known as: DELTASONE Take 4 tablets (40 mg total) by mouth daily for 3 days, THEN 3 tablets (30 mg total) daily for  3 days, THEN 2 tablets (20 mg total) daily for 3 days, THEN 1 tablet (10 mg total) daily for 3  days. Start taking on: April 17, 2019        Procedures/Studies: Dg Chest Port 1 View  Result Date: 04/14/2019 CLINICAL DATA:  Dyspnea, COVID-19 positive EXAM: PORTABLE CHEST 1 VIEW COMPARISON:  None. FINDINGS: Normal heart size. Normal mediastinal contour. No pneumothorax. No convincing pleural effusion. Extensive patchy opacities throughout the periphery of both lungs, most prominent in the peripheral upper left lung. IMPRESSION: Extensive patchy opacities throughout the periphery of both lungs, compatible with COVID-19 pneumonia. Electronically Signed   By: Ilona Sorrel M.D.   On: 04/14/2019 20:52    Subjective: No acute issues/events overnight. Patient feels quite well this morning.   Discharge Exam: Vitals:   04/17/19 0500 04/17/19 0800  BP: (!) 121/56 132/71  Pulse: 72 73  Resp: 18 18  Temp: 98.7 F (37.1 C) 98.2 F (36.8 C)  SpO2: 91% 94%   Vitals:   04/16/19 2300 04/17/19 0000 04/17/19 0500 04/17/19 0800  BP:   (!) 121/56 132/71  Pulse: 63 62 72 73  Resp:   18 18  Temp:   98.7 F (37.1 C) 98.2 F (36.8 C)  TempSrc:   Oral Oral  SpO2: 95% (!) 89% 91% 94%  Weight:      Height:        General:  Pleasantly resting in bed, No acute distress. HEENT:  Normocephalic atraumatic.  Sclerae nonicteric, noninjected.  Extraocular movements intact bilaterally. Neck:  Without mass or deformity.  Trachea is midline. Lungs:  Clear to auscultate bilaterally without rhonchi, wheeze, or rales. Heart:  Regular rate and rhythm.  Without murmurs, rubs, or gallops. Abdomen:  Soft, nontender, nondistended.  Without guarding or rebound. Extremities: Without cyanosis, clubbing, edema, or obvious deformity. Vascular:  Dorsalis pedis and posterior tibial pulses palpable bilaterally. Skin:  Warm and dry, no erythema, no ulcerations.   The results of significant diagnostics from this hospitalization (including imaging, microbiology, ancillary and laboratory) are listed below for reference.      Microbiology: Recent Results (from the past 240 hour(s))  Blood Culture (routine x 2)     Status: None (Preliminary result)   Collection Time: 04/14/19  8:40 PM   Specimen: BLOOD  Result Value Ref Range Status   Specimen Description BLOOD RIGHT ANTECUBITAL  Final   Special Requests   Final    BOTTLES DRAWN AEROBIC AND ANAEROBIC Blood Culture adequate volume   Culture   Final    NO GROWTH 3 DAYS Performed at [facility], [address] [place], [place] [address]    Report Status PENDING  Incomplete  Blood Culture (routine x 2)     Status: None (Preliminary result)   Collection Time: 04/14/19  8:55 PM   Specimen: BLOOD  Result Value Ref Range Status   Specimen Description BLOOD LEFT ANTECUBITAL  Final   Special Requests   Final    BOTTLES DRAWN AEROBIC AND ANAEROBIC Blood Culture adequate volume   Culture   Final    NO GROWTH 3 DAYS Performed at [facility], [place] [address], [place], [place] [address]    Report Status PENDING  Incomplete     Labs: BNP (last 3 results) No results for input(s): BNP in the last 8760 hours. Basic Metabolic Panel: Recent Labs  Lab 04/14/19 2037 04/15/19 0525 04/16/19 0423 04/17/19 0243  NA 134* 137 138 139  K 3.9 3.6  4.0 4.3  CL 106 106 108 109  CO2 19* 23 24 22   GLUCOSE 110* 106* 89 158*  BUN 11 13 13 18   CREATININE 0.63 0.60 0.66 0.60  CALCIUM 8.3* 8.4* 8.6* 9.1   Liver Function Tests: Recent Labs  Lab 04/14/19 2037 04/15/19 0525 04/16/19 0423 04/17/19 0243  AST 24 23 24 26   ALT 26 24 33 43  ALKPHOS 74 74 80 87  BILITOT 0.9 0.6 0.5 0.3  PROT 6.5 6.6 6.5 6.8  ALBUMIN 3.5 3.5 3.5 3.6   No results for input(s): LIPASE, AMYLASE in the last 168 hours. No results for input(s): AMMONIA in the last 168 hours. CBC: Recent Labs  Lab 04/14/19 2037 04/15/19 0525 04/16/19 0423 04/17/19 0243  WBC 8.1 7.0 6.0 7.8  NEUTROABS 6.0 4.7 3.4 6.3  HGB 12.7 11.8* 11.8* 12.0  HCT 38.7 36.0 36.7 36.8  MCV 89.6 89.1 90.0  88.9  PLT 231 248 287 347   Cardiac Enzymes: No results for input(s): CKTOTAL, CKMB, CKMBINDEX, TROPONINI in the last 168 hours. BNP: Invalid input(s): POCBNP CBG: No results for input(s): GLUCAP in the last 168 hours. D-Dimer Recent Labs    04/16/19 0423 04/17/19 0243  DDIMER 0.43 0.35   Hgb A1c No results for input(s): HGBA1C in the last 72 hours. Lipid Profile Recent Labs    04/14/19 2037  TRIG 135   Thyroid function studies No results for input(s): TSH, T4TOTAL, T3FREE, THYROIDAB in the last 72 hours.  Invalid input(s): FREET3 Anemia work up [employer]    04/14/19 2037  FERRITIN 275   Urinalysis No results found for: COLORURINE, APPEARANCEUR, LABSPEC, PHURINE, GLUCOSEU, HGBUR, BILIRUBINUR, KETONESUR, PROTEINUR, UROBILINOGEN, NITRITE, LEUKOCYTESUR Sepsis Labs Invalid input(s): PROCALCITONIN,  WBC,  LACTICIDVEN Microbiology Recent Results (from the past 240 hour(s))  Blood Culture (routine x 2)     Status: None (Preliminary result)   Collection Time: 04/14/19  8:40 PM   Specimen: BLOOD  Result Value Ref Range Status   Specimen Description BLOOD RIGHT ANTECUBITAL  Final   Special Requests   Final    BOTTLES DRAWN AEROBIC AND ANAEROBIC Blood Culture adequate volume   Culture   Final    NO GROWTH 3 DAYS Performed at [facility], [address] [place], [place] [address]    Report Status PENDING  Incomplete  Blood Culture (routine x 2)     Status: None (Preliminary result)   Collection Time: 04/14/19  8:55 PM   Specimen: BLOOD  Result Value Ref Range Status   Specimen Description BLOOD LEFT ANTECUBITAL  Final   Special Requests   Final    BOTTLES DRAWN AEROBIC AND ANAEROBIC Blood Culture adequate volume   Culture   Final    NO GROWTH 3 DAYS Performed at [facility], [address] [place], [place] [address]    Report Status PENDING  Incomplete   Time coordinating discharge: Over 30 minutes  SIGNED:  Azucena FallenWilliam C Azka Steger, DO [facility] 04/17/2019, 3:28 PM Pager   If 7PM-7AM, please contact night-coverage [email or web address] Password [email or web address]

## 2019-04-19 LAB — CULTURE, BLOOD (ROUTINE X 2)
Culture: NO GROWTH
Culture: NO GROWTH
Special Requests: ADEQUATE
Special Requests: ADEQUATE

## 2019-08-08 IMAGING — CR PORTABLE CHEST - 1 VIEW
1 series · 1 of 1 positions shown · non-contrast
Comparison: None.

CLINICAL DATA: Dyspnea, [id number] positive

EXAM:
PORTABLE CHEST 1 VIEW

[AP]
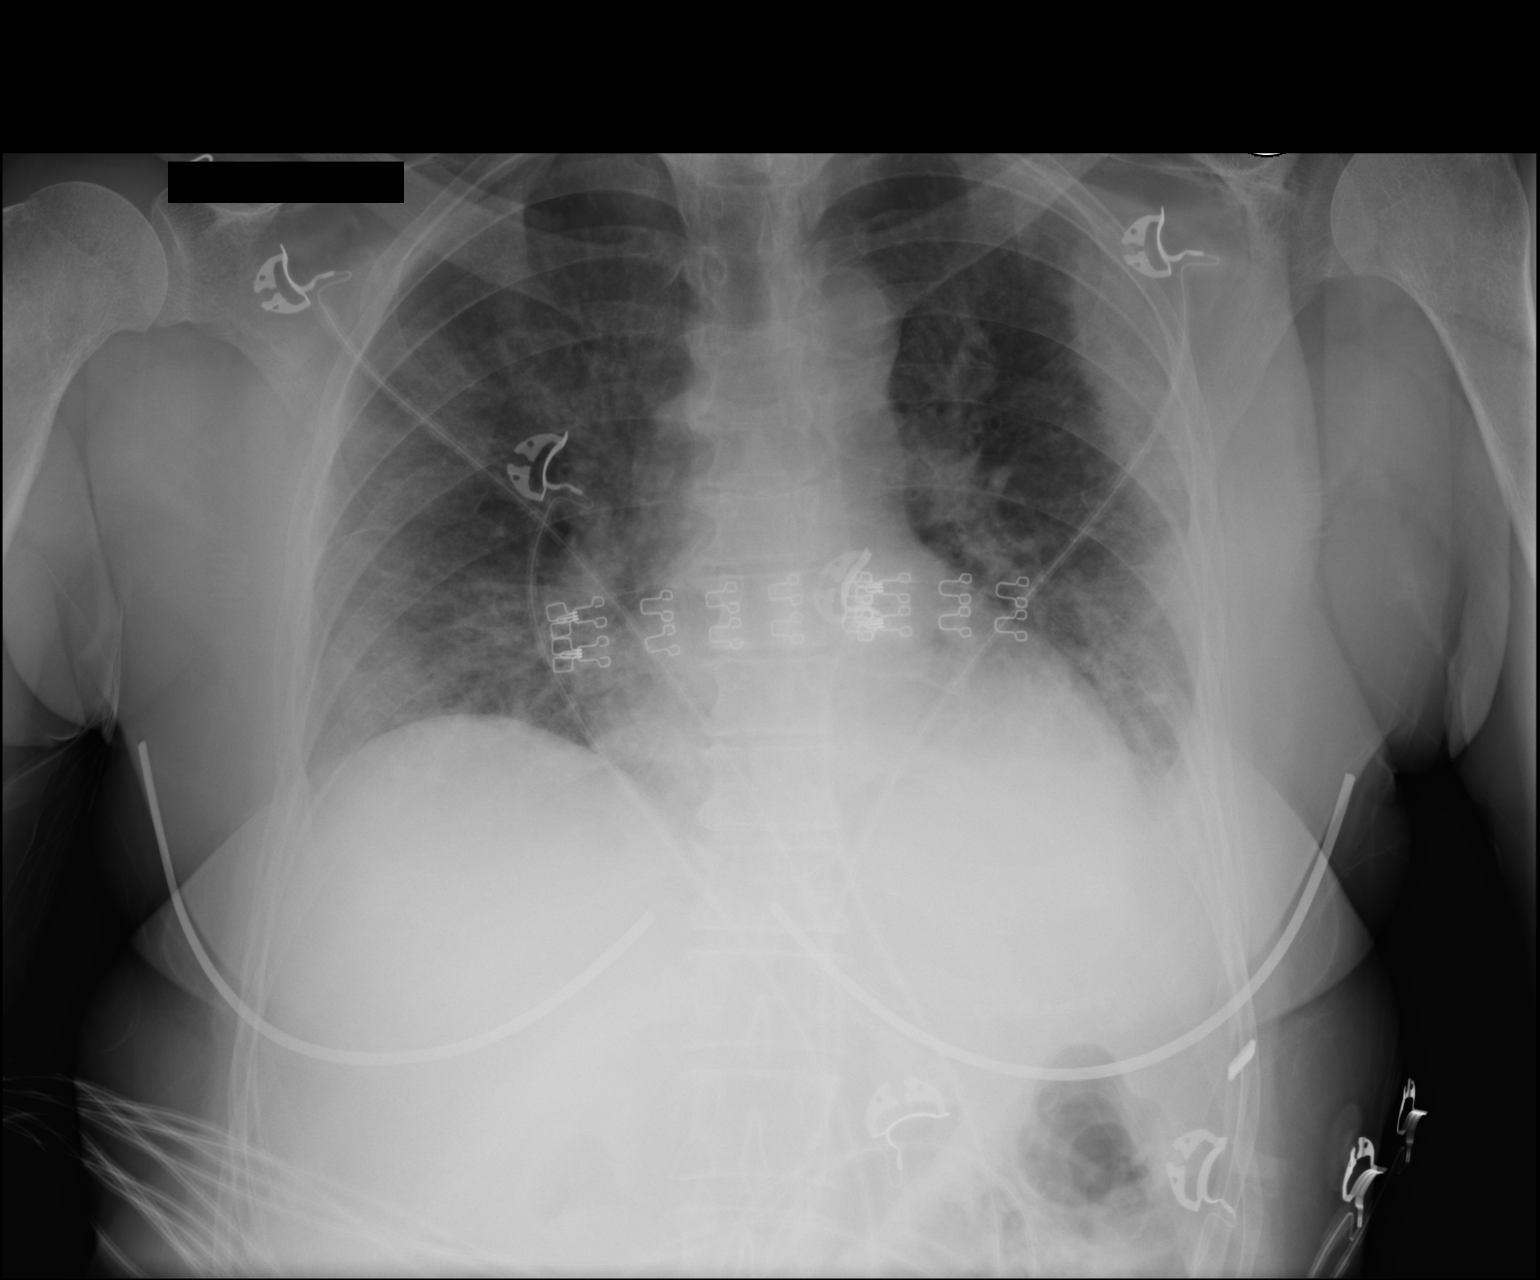

[1 of 1 positions shown; findings below may reference images not displayed]

FINDINGS: Normal heart size. Normal mediastinal contour. No pneumothorax. No
convincing pleural effusion. Extensive patchy opacities throughout
the periphery of both lungs, most prominent in the peripheral upper
left lung.
IMPRESSION: Extensive patchy opacities throughout the periphery of both lungs,
compatible with 4QYWI-TX pneumonia.

## 2019-09-14 ENCOUNTER — Ambulatory Visit
Admission: RE | Admit: 2019-09-14 | Discharge: 2019-09-14 | Disposition: A | Discharge status: Home / Self Care | Payer: PRIVATE HEALTH INSURANCE | Source: Ambulatory Visit | Attending: Internal Medicine | Admitting: Internal Medicine

## 2019-09-14 ENCOUNTER — Other Ambulatory Visit: Payer: Self-pay | Admitting: Internal Medicine

## 2019-09-14 ENCOUNTER — Other Ambulatory Visit: Payer: Self-pay

## 2019-09-14 DIAGNOSIS — R921 Mammographic calcification found on diagnostic imaging of breast: Secondary | ICD-10-CM

## 2020-01-07 ENCOUNTER — Other Ambulatory Visit: Payer: Self-pay

## 2020-01-08 ENCOUNTER — Ambulatory Visit (INDEPENDENT_AMBULATORY_CARE_PROVIDER_SITE_OTHER): Payer: PRIVATE HEALTH INSURANCE | Admitting: Obstetrics and Gynecology

## 2020-01-08 ENCOUNTER — Encounter: Payer: Self-pay | Admitting: Obstetrics and Gynecology

## 2020-01-08 VITALS — BP 118/78 | Ht 64.0 in | Wt 147.0 lb

## 2020-01-08 DIAGNOSIS — N814 Uterovaginal prolapse, unspecified: Secondary | ICD-10-CM

## 2020-01-08 DIAGNOSIS — R350 Frequency of micturition: Secondary | ICD-10-CM

## 2020-01-08 NOTE — Patient Instructions (Addendum)
Educacin para el paciente: Prolapso de rganos plvicos (Conceptos Bsicos) Redactado por los mdicos y editores de UpToDate  Qu es el prolapso de rganos plvicos?--El prolapso de rgano plvico es un padecimiento que afecta el "piso plvico". As se denomina a los msculos que [employer] rganos de la pelvis, como la vejiga, el recto y el tero (figura 1). El prolapso de rgano plvico se produce cuando esos msculos se relajan demasiado, lo que hace que los rganos se desplacen hacia abajo, presionando la vagina o sobresaliendo por esta. El prolapso puede afectar distintos rganos (figura 2). Los mdicos utilizan un nombre distinto para cada tipo de prolapso: ?Vejiga - Si la vejiga sobresale por la vagina, se llama "cistocele". ?Recto - Si el recto sobresale por la vagina, se llama "rectocele". ?tero - Si el tero sobresale por la vagina, se llama "prolapso uterino". Algunas cosas pueden aumentar el riesgo de sufrir un prolapso de rgano plvico, como por ejemplo el [place], la obesidad y la [place]. Cules son los sntomas del prolapso de rganos plvicos?--Muchas veces, el prolapso no causa ningn sntoma. Sin embargo, cuando aparecen sntomas, algunos pueden ser: ?Sensacin de llenura o presin en la pelvis o la vagina  ?Sensacin de dolor en la pelvis ?Un bulto en la vagina o que sale por la vagina ?Prdida de orina al rer, toser o estornudar ?Necesidad de Geographical information systems officer en forma repentina ?Dificultad para evacuar Cuando vaya al bao, tal vez necesite presionar con el dedo el bulto de la vagina para que la orina o la evacuacin termine de [place]. Existe alguna prueba para detectar un prolapso de rganos plvicos?--El mdico o enfermero puede determinar si lo tiene al hacerle un examen plvico. Hay algo que pueda hacer por mi cuenta para sentirme mejor?--S. Algunas personas se sienten mejor si hacen ejercicios con los msculos plvicos. Estos ejercicios fortalecen los  msculos que controlan el flujo de [place] y las evacuaciones. Tambin se los conoce como "ejercicios de [place]". Su mdico o enfermero puede ensearle a hacerlos o remitirlo a un fisioterapeuta que se especialice en problemas del piso plvico. Cmo se trata el prolapso de rganos plvicos?--[employer] no tienen sntomas o que no sienten molestias por sus sntomas no Electronics engineer. Si tiene sntomas molestos, estas podran ser algunas opciones de tratamiento: ?Ejercicios para los msculos del piso plvico - Consiste en trabajar con un fisioterapeuta durante 8 a 12 semanas para fortalecer los msculos plvicos. ?Un pesario vaginal - Este dispositivo se coloca dentro de la vagina para sostener la vejiga y desplazarla a Nature conservation officer original. Los pesarios vienen en diferentes formas y Veterinary surgeon. Su mdico o enfermero le hablar sobre sus opciones y se asegurar de que el pesario sea adecuado para su cuerpo.  ?Ciruga - Un cirujano puede desplazar los rganos hacia su ubicacin original y Chief Operating Officer los tejidos que los mantienen en su Environmental consultant. Solo debe considerar este tipo de ciruga si no piensa tener hijos o si ya tiene hijos y no desea tener ms. El prolapso de rganos plvicos se puede prevenir?--Puede disminuir sus posibilidades de sufrir prolapso de rgano plvico si: ?[place], si tiene sobrepeso ?Recibe tratamiento para el estreimiento, si tiene estreimiento ?Evita actividades en las que debe levantar objetos pesados Ms informacin sobre este tema Educacin para el paciente: Ejercicios con los msculos plvicos (ejercicios de Kegel) (Conceptos Bsicos) Educacin para el paciente: Estreimiento en adultos (Conceptos Bsicos) Educacin para el paciente: Incontinencia urinaria (Conceptos Bsicos) Patient education: Pelvic floor muscle exercises (Beyond the Chiropodist) [employer]  artculos se actualizan a medida que se descubre nueva evidencia y [place] de evaluacin por  homlogos  Este artculo se recuper de UpToDate el:Jan 08, 2020. El contenido del sitio Web de UpToDate no tiene por objeto sustituir la opinin, el diagnstico o el tratamiento mdico, ni se recomienda que los sustituya. Siempre debe pedir la opinin de su mdico personal o de cualquier otro profesional de atencin mdica con respecto a cualquier pregunta o padecimiento mdico que pueda tener. El uso de este sitio web se rige por los Trminos de uso de UpToDate 2021 UpToDate, Inc. [employer] derechos reservados. Artculo 17225 Versin [id number]  GRFICOS  Msculos del piso plvico  El "piso plvico"es un grupo de msculos que sostienen los rganos de la pelvis. En las mujeres, algunos de esos rganos son el tero, la vejiga y Administrator. Grfico [id number] Versin [id number]  Prolapso de rgano [employer] del "piso plvico" se relajan, los rganos pueden desplazarse hacia abajo y sobresalir por la vagina. En cada imagen, la flecha negra muestra cmo se mueven los rganos. En la imagen A los rganos estn en posicin normal. En la imagen B se observa un prolapso de la vejiga (llamado "cistocele" o "prolapso de la pared vaginal anterior"). En la imagen C se observa un prolapso del recto (llamado "rectocele" o "prolapso de la pared vaginal posterior"). En la imagen D se observa un prolapso del tero (llamado prolapso uterino).      Educacin para el paciente: Histerectoma (Conceptos Bsicos)  Redactado por los mdicos y editores de UpToDate Qu es la histerectoma?--La histerectoma es una ciruga para sacar el tero (figura 1). El tero es la parte del cuerpo de la mujer donde se aloja un beb en caso de [place]. Otra palabra para tero es "matriz". Si se hace una histerectoma, no podr quedar embarazada. Existen diferentes tipos de [place] para la histerectoma?--S, existen cuatro tipos principales de ciruga: ?Histerectoma vaginal - Para hacer una histerectoma  vaginal, el mdico hace cortes en el interior de la vagina y retira el tero a travs de la vagina (figura 2). En algunos casos, los mdicos realizan una histerectoma vaginal pero tambin usan los instrumentos utilizados en una histerectoma laparoscpica a travs del rea del estmago. Este procedimiento se llama "histerectoma vaginal con asistencia laparoscpica". Si se [employer], el tero se retira a travs de la vagina. ?Histerectoma laparoscpica - Para hacer una histerectoma laparoscpica, el mdico introduce una pequea cmara e instrumentos a travs de pequeas aberturas en el rea del estmago. Luego retira el tero en una bolsa a travs de uno de los orificios o a travs de la vagina. ?Histerectoma robtica -Como variacin de la tcnica laparoscpica, las herramientas utilizadas para la ciruga se instalan en un robot controlado por el mdico. Esto se llama "laparoscopa asistida por robot". ?Histerectoma abdominal - Para hacer una histerectoma abdominal, el mdico hace un corte en el rea del estmago y retira el tero a travs de esa abertura (figura 3). Esto se realiza nicamente cuando la mujer no puede someterse a los otros tipos de histerectoma, ya que la recuperacin de la histerectoma abdominal es ms larga. Por qu podra necesitar una histerectoma?--La histerectoma podra realizarse para tratar lo siguiente: ?Sangrado anormal - Algunas mujeres sangran mucho durante su periodo o en momentos en los que no Psychiatric nurse. Esto puede llevar a un padecimiento llamado anemia, que puede producir mucho cansancio. ?Fibromas - Los fibromas son bolas duras de msculo que se forman en  el tero. Se pueden volver muy grandes y Oceanographer presin en los rganos del rea del [place]. Tambin pueden causar un sangrado anormal. ?Prolapso de rgano plvico - El prolapso de rgano plvico se produce cuando el tero cae hacia la vagina (figura 4). ?Cncer o padecimientos que Geophysical data processor - El cncer puede atacar al tero o al cuello uterino, que es el rgano que separa el tero de la vagina. En algunos casos, los mdicos recomiendan sacar estos rganos si muestran indicios de que se podra formar Estate agent. ?Dolor plvico constante - Algunas mujeres tienen dolores constantes justo debajo del rea del [place]. Esto se llama "dolor plvico crnico". A veces, la histerectoma puede ayudar a tratar [employer]. Qu sucede si no quiero hacerme una histerectoma?--Muchos de los padecimientos que se tratan con una histerectoma tambin se pueden tratar de [employer]. Si no quiere [employer], pregntele a su mdico o enfermero si tiene otras opciones de [place]. Pregntele adems qu suceder si no se hace la histerectoma. Qu sucede si quiero quedar embarazada?--Si se hace una histerectoma, ya no podr [employer]. Desafortunadamente, si tiene cncer u otro problema grave, es posible que no pueda evitar hacerse una histerectoma. Si quiere tener hijos, hable con su mdico o enfermero acerca de las opciones de que dispone. El tero es el nico rgano que se retira en una histerectoma?--Eso depende de lo que usted [place] y [place]. Durante una histerectoma, los mdicos a veces tambin retiran: ?El cuello uterino - El cuello uterino separa el tero de la vagina. En la histerectoma vaginal, se debe sacar el cuello uterino. En una histerectoma abdominal o laparoscpica, se puede quitar la parte superior del tero, y el cuello uterino se puede retirar o Electronics engineer. Si se retira el cuello uterino, se llama histerectoma "total". Si el cuello uterino se deja en su sitio, se llama histerectoma "supracervical". Las mujeres que conservan el cuello uterino tienen que seguir hacindose pruebas de [place]apan[place] peridicas para la deteccin del cncer crvicouterino. ?Los ovarios y las trompas [place] ovarios son los  rganos que producen vulos (que pueden convertirse en un beb) y hormonas femeninas, como el estrgeno y Field seismologist. Las trompas de [employer] vulos desde los ovarios hasta el tero. Las hormonas producidas por los ovarios ayudan a que los huesos se mantengan saludables y son importantes para otros aspectos de Technical sales engineer. Las mujeres a las cuales se les [employer] ovarios a Quarry manager un tratamiento hormonal. Si va a IT sales professional, pregntele a su mdico si planea retirarle el cuello uterino, los ovarios y las trompas de [place]. Es importante saber esto, porque las mujeres sin cuello uterino requieren una atencin mdica distinta a la de las mujeres que s lo tienen. De la misma [place], las mujeres sin ovarios a Clinical cytogeneticist necesitan una atencin mdica diferente a la de aquellas que s los tienen. Si me hago una histerectoma, debo sacarme los ovarios y las trompas?--Es importante que antes de la ciruga hable con el mdico acerca de la posibilidad de [employer] ovarios. Debe considerar su edad y cmo podra afectarla no tener ovarios. En las mujeres que no han atravesado la menopausia, no tener ovarios puede producir bochornos, prdida de masa sea, menos inters en el sexo y otros problemas. En las mujeres que ya [employer] menopausia, [employer] ovarios tambin podra aumentar el riesgo de problemas de [place], como las enfermedades cardacas, [place]  resultados [employer] an no son claros. Por otro lado, las mujeres que tienen problemas de salud que empeoran en determinados momentos de su ciclo menstrual a veces se sienten mejor sin los ovarios. Adems, en casos poco frecuentes, los ovarios pueden desarrollar cncer, por lo que las mujeres a veces deciden sacrselos. Antes de la [place], hable con su mdico acerca de los beneficios y los riesgos de [employer] ovarios. Las trompas de [place] no son necesarias si la mujer no va a Scientist, research (physical sciences). Se pueden  sacar en el momento de la histerectoma, incluso si se dejan los ovarios. Esto puede disminuir el riesgo de un tipo poco frecuente de cncer que puede comenzar en las trompas de [place]. Cmo ser mi vida?--Los estudios [employer] mujeres pueden vivir una vida Passenger transport manager y plena despus de una histerectoma. Muchas mujeres se sienten mejor despus de la [place], porque ya no tienen los sntomas que las molestaban antes. Ms informacin sobre este tema Educacin para el paciente: Periodos intensos (Conceptos Bsicos) Educacin para el paciente: Fibromas uterinos (Conceptos Bsicos) Educacin para el paciente: Database administrator uterino (Conceptos Bsicos) Educacin para el paciente: Database administrator de ovario (Conceptos Bsicos) Educacin para el paciente: Tumor de clulas de [place] (Conceptos Bsicos) Educacin para el paciente: Prolapso de rganos plvicos (Conceptos Bsicos) Patient education: Abdominal hysterectomy (Beyond the Basics) Patient education: Vaginal hysterectomy (Beyond the Basics) Todos los artculos se actualizan a medida que se descubre nueva evidencia y culmina nuestro proceso de evaluacin por homlogos

## 2020-01-08 NOTE — Progress Notes (Signed)
Courtney Richard Malesha Suliman  [birth year] [id number]  HPI The patient is a 69 y.o. G3P3 who presents today for a 2 month history of vaginal bulge.  Feeling no pain or pressure.  It is just bothering her.  She is feeling like she needs to urinate more often.  She feels her bladder empties fine, no pain with urination.  No problems with defecation.  She had slight vaginal bleeding when she was working just one time.  No ongoing bleeding.  Spanish interpreter is present for the visit today.  Past medical history,surgical history, problem list, medications, allergies, family history and social history were all reviewed and documented as reviewed in the EPIC chart.  ROS:  Feeling well. No dyspnea or chest pain on exertion.  No abdominal pain, change in bowel habits, black or bloody stools.  + urinary frequency.  No dysuria.  GYN ROS: no pelvic pain or discharge, no breast pain or new or enlarging lumps on self exam. No neurological complaints.  Physical Exam  BP 118/78   Ht 5\' 4"  (1.626 m)   Wt 147 lb (66.7 kg)   BMI 25.23 kg/m   General: Pleasant female, no acute distress, alert and oriented PELVIC EXAM: VULVA: normal appearing vulva with no masses, tenderness or lesions, VAGINA: normal appearing vagina with normal color and discharge, no lesions, lamination of Valsalva indicates a grade 2 prolapse, split speculum examination reveals grade 2 cystocele, grade 1 uterine descensus, no appreciable rectocele, CERVIX: normal appearing cervix without discharge or lesions, UTERUS: uterus is normal size, shape, consistency and nontender, ADNEXA: normal adnexa in size, nontender and no masses, RECTAL: normal rectal, no masses Urinalysis essentially negative  Caryn Bee present for examination   Assessment 69 yo G3P3 with symptomatic cystocele and mild uterine prolapse  Plan I discussed the etiology and risk factors for pelvic prolapse.  The cystocele is the most prominent component of her prolapse.   I used anatomical diagrams to discuss the anatomy of her type of prolapse.  In general, management of prolapse can be expectant, a trial of pelvic floor physical therapy and/or pessary may be employed, or surgical correction can be considered.  I reassured her that the prolapse issue that she is having in and of itself will not harm her health long-term.  Therefore I reiterated that she does not need to necessarily have to do anything to manage the prolapse.  However, given time and daily activities prolapse can become worse and perhaps become more symptomatic in terms of disrupting function of bowel or bladder and/or causing pelvic discomfort symptoms.  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  I indicated that she does continue this job, I would not recommend surgical intervention at this time until she is found a more sedentary type job for the long-term as to reduce her risk of recurrent prolapse.  I discussed pessary management and how it would entail maintenance of the pessary with regular cleaning and removal.  Pessary would need to be fitted and a separate appointment.  She not interested in a trial of pessary management.  The patient would like definitive therapy.  I emphasized that pelvic prolapse surgery has a fairly high failure rate with recurrence of prolapse possible.  She would be a reasonable candidate to pursue surgical management in the form of vaginal hysterectomy with BSO (if able) and an anterior colporrhaphy.  I discussed some considerations with the  patient if she were to choose surgical management.  She would need to consider potential time away from work/heavy physical activity of up to several weeks for recovery purposes.  I discussed the lifting restrictions and pelvic rest restrictions of at least 6 weeks.  The surgery itself has risks including infection, bleeding, possible need for blood  transfusion, risk of injury to surrounding organs in particular the bowel, bladder, and ureters.    I provided her with information in Spanish obtained from up-to-date regarding pelvic organ prolapse including diagrams, and hysterectomy information  If she does decide to pursue surgery, I would like to have her back to further discuss other risks of the surgery to include fistula formation and possible need for future corrective surgeries, risk of persistent dyspareunia of about 15% and there is also possibility of over-correction and narrowing of the vagina.  The procedure can also reveal occult stress urinary incontinence in some cases even if this was not a problem before the surgery.   She will take time to review the information and will let me know if she would like to return for further discussion and planning.  All questions were answered by the end of the visit.   Theresia Majors MD, FACOG 01/08/20

## 2020-01-10 LAB — URINALYSIS, COMPLETE W/RFL CULTURE
Bilirubin Urine: NEGATIVE
Glucose, UA: NEGATIVE
Hyaline Cast: NONE SEEN /LPF
Ketones, ur: NEGATIVE
Nitrites, Initial: NEGATIVE
Protein, ur: NEGATIVE
Specific Gravity, Urine: 1.01 (ref 1.001–1.03)
pH: 7 (ref 5.0–8.0)

## 2020-01-10 LAB — URINE CULTURE
MICRO NUMBER:: 10307515
SPECIMEN QUALITY:: ADEQUATE

## 2020-01-10 LAB — CULTURE INDICATED

## 2020-01-16 ENCOUNTER — Ambulatory Visit: Payer: PRIVATE HEALTH INSURANCE | Admitting: Obstetrics and Gynecology

## 2020-01-24 ENCOUNTER — Other Ambulatory Visit: Payer: Self-pay

## 2020-01-24 ENCOUNTER — Ambulatory Visit
Admission: RE | Admit: 2020-01-24 | Discharge: 2020-01-24 | Disposition: A | Discharge status: Home / Self Care | Payer: PRIVATE HEALTH INSURANCE | Source: Ambulatory Visit | Attending: Internal Medicine | Admitting: Internal Medicine

## 2020-01-24 DIAGNOSIS — R921 Mammographic calcification found on diagnostic imaging of breast: Secondary | ICD-10-CM

## 2020-02-25 ENCOUNTER — Telehealth: Payer: Self-pay

## 2020-02-25 NOTE — Telephone Encounter (Signed)
Daughter called to let you know that patient has decided she would like to proceed with surgery. Ready to schedule.

## 2020-02-26 ENCOUNTER — Encounter: Payer: Self-pay | Admitting: Obstetrics and Gynecology

## 2020-02-26 NOTE — Telephone Encounter (Signed)
Please see my message. Thank you.

## 2020-02-28 ENCOUNTER — Telehealth: Payer: Self-pay

## 2020-02-28 NOTE — Telephone Encounter (Signed)
Courtney Richard spoke with patient.  "So she agreed to Aug 9 but does want [place] to call her if you have a cancellation sooner. She scheduled her pre op for Aug 4 at 4:00 pm ."

## 2020-02-28 NOTE — Telephone Encounter (Signed)
I asked Rosemarie Ax to call and arrange surgery with this Spanish speaking patient.    "I checked her ins benefits $500.00 deductible with -0- met. 80/20 co-insurance. $2500.00 out of pocket maximum and $301.19 of that is met. Her estimated surgery prepaymt includes her $500 deductible and her 20% =$474 for a total prepaymt due to [facility] by one week prior to surgery of $974.00. I will mail her a financial letter in her packet.   The first available date for this surgery is Monday August 9 at 7:30am at [facility]. Patient will need to go on Thursday August 5th for drive thru Covid screen. (PLEASE make sure she has not had Covid in last 90 days. If she has, she does not need to go for Covid test.) After Covid screen she will need to quarantine at home until surgery--even if she is fully vaccinated. Let me know what time between 9-3 she desires and I will schedule her as close to it as possible and include the date/time on the sheet about Covid test I mail her in her packet.   I will mail her a pamphlet from [facility]. Address, map, phone number and a pic of the building is in the pamphlet.   She will need in person pre op visit with Dr. Delilah Shan. "

## 2020-02-28 NOTE — Telephone Encounter (Signed)
Courtney Richard called this Spanish speaking patient on my behalf.  She relayed to her at start of conversation that first available date for this surgery is Aug 8th. Then she messaged me this. "Patient informed of cost and date of surgery. She does not want to wait until August 9 she wants something sooner. I explain that we do not have anything sooner she states she is desperate and can not wait until August"  I asked Courtney Richard to call her back and tell her I am sympathetic and I went back through June and July OR schedules and there is not a place for this surgery case.  I promised to call her if I get a cancellation where I can move her surgery up earlier to.  Courtney Richard will call her back to relay this to her.

## 2020-03-11 ENCOUNTER — Ambulatory Visit
Admission: RE | Admit: 2020-03-11 | Discharge: 2020-03-11 | Disposition: A | Discharge status: Home / Self Care | Payer: PRIVATE HEALTH INSURANCE | Source: Ambulatory Visit | Attending: Internal Medicine | Admitting: Internal Medicine

## 2020-03-11 ENCOUNTER — Other Ambulatory Visit: Payer: Self-pay

## 2020-03-13 ENCOUNTER — Telehealth: Payer: Self-pay

## 2020-03-13 NOTE — Telephone Encounter (Signed)
Courtney Richard called patient as interpretor to let her know that Dr. Penni Bombard would like to have medical clearance from her PCP for her surgery. She confirmed Dr. Doree Barthel is her PCP.  Informed I will send Dr. Doree Barthel a request form.

## 2020-03-31 ENCOUNTER — Encounter: Payer: Self-pay | Admitting: Gynecology

## 2020-04-22 ENCOUNTER — Encounter: Payer: Self-pay | Admitting: Gynecology

## 2020-05-12 NOTE — Patient Instructions (Addendum)
DUE TO COVID-19 ONLY ONE VISITOR IS ALLOWED IN WAITING ROOM (VISITOR WILL HAVE A TEMPERATURE CHECK ON ARRIVAL AND MUST WEAR A FACE MASK THE ENTIRE TIME.)  ONCE YOU ARE ADMITTED TO YOUR PRIVATE ROOM, THE SAME ONE VISITOR IS ALLOWED TO VISIT DURING VISITING HOURS ONLY.  Your COVID swab testing is scheduled for  at  , 05/15/20 at 3:10 You must self quarantine after your testing per handout given to you at the testing site. [address] [place], [place] [address]  (Must self quarantine after testing. Follow instructions on handout.)        Your procedure is scheduled on:05/19/20  Report to [facility] AT  5:30  A. M.   Call this number if you have problems the morning of surgery:[phone].   OUR ADDRESS IS [address].  WE ARE LOCATED IN THE [facility].                                     REMEMBER:  DO NOT EAT FOOD OR DRINK LIQUIDS AFTER MIDNIGHT .     BRUSH YOUR TEETH THE MORNING OF SURGERY.  TAKE THESE MEDICATIONS MORNING OF SURGERY WITH A SIP OF WATER:  Metoprolol succinate    DO NOT WEAR JEWERLY, MAKE UP, OR NAIL POLISH.  DO NOT WEAR LOTIONS, POWDERS, PERFUMES/COLOGNE OR DEODORANT.  DO NOT SHAVE FOR 24 HOURS PRIOR TO DAY OF SURGERY.    CONTACTS, GLASSES, OR DENTURES MAY NOT BE WORN TO SURGERY.                                    [facility] IS NOT RESPONSIBLE  FOR ANY BELONGINGS.                                                                 [facility] - Preparing for Surgery Before surgery, you can play an important role.  Because skin is not sterile, your skin needs to be as free of germs as possible.  You can reduce the number of germs on your skin by washing with CHG (chlorahexidine gluconate) soap before surgery.  CHG is an antiseptic cleaner which kills germs and bonds with the skin to continue killing germs even after washing. Please DO NOT use if you have an allergy to CHG or antibacterial soaps.  If your skin becomes  reddened/irritated stop using the CHG and inform your nurse when you arrive at Short Stay. Do not shave (including legs and underarms) for at least 48 hours prior to the first CHG shower.   Please follow these instructions carefully:  1.  Shower with CHG Soap the night before surgery and the  morning of Surgery.  2.  If you choose to wash your hair, wash your hair first as usual with your  normal  shampoo.  3.  After you shampoo, rinse your hair and body thoroughly to remove the  shampoo.  4.  Use CHG as you would any other liquid soap.  You can apply chg directly  to the skin and wash                       Gently with a scrungie or clean washcloth.  5.  Apply the CHG Soap to your body ONLY FROM THE NECK DOWN.   Do not use on face/ open                           Wound or open sores. Avoid contact with eyes, ears mouth and genitals (private parts).                       Wash face,  Genitals (private parts) with your normal soap.             6.  Wash thoroughly, paying special attention to the area where your surgery  will be performed.  7.  Thoroughly rinse your body with warm water from the neck down.  8.  DO NOT shower/wash with your normal soap after using and rinsing off  the CHG Soap.             9.  Pat yourself dry with a clean towel.            10.  Wear clean pajamas.            11.  Place clean sheets on your bed the night of your first shower and do not  sleep with pets. Day of Surgery : Do not apply any lotions/deodorants the morning of surgery.  Please wear clean clothes to the hospital/surgery center.  FAILURE TO FOLLOW THESE INSTRUCTIONS MAY RESULT IN THE CANCELLATION OF YOUR SURGERY PATIENT SIGNATURE_________________________________  NURSE SIGNATURE__________________________________  ________________________________________________________________________

## 2020-05-13 ENCOUNTER — Other Ambulatory Visit: Payer: Self-pay

## 2020-05-13 ENCOUNTER — Encounter (HOSPITAL_COMMUNITY)
Admission: RE | Admit: 2020-05-13 | Discharge: 2020-05-13 | Disposition: A | Discharge status: Home / Self Care | Payer: PRIVATE HEALTH INSURANCE | Source: Ambulatory Visit | Attending: Obstetrics and Gynecology | Admitting: Obstetrics and Gynecology

## 2020-05-13 ENCOUNTER — Encounter (HOSPITAL_COMMUNITY): Payer: Self-pay

## 2020-05-13 ENCOUNTER — Encounter (INDEPENDENT_AMBULATORY_CARE_PROVIDER_SITE_OTHER): Payer: Self-pay

## 2020-05-13 ENCOUNTER — Encounter (HOSPITAL_BASED_OUTPATIENT_CLINIC_OR_DEPARTMENT_OTHER): Payer: Self-pay | Admitting: Obstetrics and Gynecology

## 2020-05-13 DIAGNOSIS — Z01818 Encounter for other preprocedural examination: Secondary | ICD-10-CM | POA: Insufficient documentation

## 2020-05-13 DIAGNOSIS — I10 Essential (primary) hypertension: Secondary | ICD-10-CM | POA: Diagnosis not present

## 2020-05-13 HISTORY — DX: Unspecified osteoarthritis, unspecified site: M19.90

## 2020-05-13 HISTORY — DX: Gastro-esophageal reflux disease without esophagitis: K21.9

## 2020-05-13 HISTORY — DX: Thyrotoxicosis, unspecified without thyrotoxic crisis or storm: E05.90

## 2020-05-13 LAB — CBC
HCT: 42.5 % (ref 36.0–46.0)
Hemoglobin: 14.1 g/dL (ref 12.0–15.0)
MCH: 29.7 pg (ref 26.0–34.0)
MCHC: 33.2 g/dL (ref 30.0–36.0)
MCV: 89.5 fL (ref 80.0–100.0)
Platelets: 289 10*3/uL (ref 150–400)
RBC: 4.75 MIL/uL (ref 3.87–5.11)
RDW: 12.1 % (ref 11.5–15.5)
WBC: 8.5 10*3/uL (ref 4.0–10.5)
nRBC: 0 % (ref 0.0–0.2)

## 2020-05-13 LAB — BASIC METABOLIC PANEL
Anion gap: 9 (ref 5–15)
BUN: 19 mg/dL (ref 8–23)
CO2: 27 mmol/L (ref 22–32)
Calcium: 9.3 mg/dL (ref 8.9–10.3)
Chloride: 103 mmol/L (ref 98–111)
Creatinine, Ser: 0.7 mg/dL (ref 0.44–1.00)
GFR calc Af Amer: >60 mL/min (ref >60)
GFR calc non Af Amer: >60 mL/min (ref >60)
Glucose, Bld: 113 mg/dL — ABNORMAL HIGH (ref 70–99)
Potassium: 3.8 mmol/L (ref 3.5–5.1)
Sodium: 139 mmol/L (ref 135–145)

## 2020-05-13 NOTE — Progress Notes (Addendum)
COVID Vaccine Completed:Yes Date COVID Vaccine completed:01/15/20 COVID vaccine manufacturer: Pfizer    PCP - Dr. Shelia Media Cardiologist - no  Chest x-ray - no EKG - 04/24/20 Stress Test - no ECHO - no Cardiac Cath - no  Sleep Study - no CPAP -   Fasting Blood Sugar - NA Checks Blood Sugar _____ times a day  Blood Thinner Instructions:NA Aspirin Instructions: Last Dose:  Anesthesia review:   Patient denies shortness of breath, fever, cough and chest pain at PAT appointment yes  Patient verbalized understanding of instructions that were given to them at the PAT appointment. Patient was also instructed that they will need to review over the PAT instructions again at home before surgery. Yes Pt denies SOB climbing stairs, doing housework or with ADLs.

## 2020-05-14 ENCOUNTER — Ambulatory Visit: Payer: PRIVATE HEALTH INSURANCE | Admitting: Obstetrics and Gynecology

## 2020-05-14 ENCOUNTER — Encounter: Payer: Self-pay | Admitting: Obstetrics and Gynecology

## 2020-05-14 VITALS — BP 126/80

## 2020-05-14 DIAGNOSIS — N814 Uterovaginal prolapse, unspecified: Secondary | ICD-10-CM

## 2020-05-14 NOTE — Progress Notes (Signed)
Courtney Richard [birth year] [id number]  SUBJECTIVE:  69 y.o. G3P3 female presents with her husband today for a preoperative discussion for planned vaginal hysterectomy with BSO and anterior colporrhaphy for pelvic organ prolapse coming up 05/19/2020.  Spanish interpreter is present for the encounter.   Current Outpatient Medications  Medication Sig Dispense Refill  . amLODipine-valsartan (EXFORGE) 10-320 MG tablet Take 1 tablet by mouth daily.    . cholecalciferol (VITAMIN D3) 25 MCG (1000 UNIT) tablet Take 1,000 Units by mouth daily.    . famotidine (PEPCID) 20 mg Inject 20 mg into the vein every 12 (twelve) hours.     . meloxicam (MOBIC) 15 MG tablet Take 15 mg by mouth daily.     . metoprolol succinate (TOPROL-XL) 25 MG 24 hr tablet Take 25 mg by mouth daily.     No current facility-administered medications for this visit.   Allergies: Patient has no known allergies.  No LMP recorded. Patient is postmenopausal.  Past medical history,surgical history, problem list, medications, allergies, family history and social history were all reviewed and documented as reviewed in the EPIC chart.   OBJECTIVE:  BP 126/80 (BP Location: Right Arm, Patient Position: Sitting, Cuff Size: Normal)  The patient appears well, alert, oriented x 3, in no distress. PELVIC EXAM: Deferred   ASSESSMENT:  69 y.o. G3P3 here for preoperative discussion for vaginal hysterectomy BSO and prolapse repair  PLAN:  The patient  maintains that she would like definitive therapy.  I re-emphasized that pelvic prolapse surgery has a fairly high failure rate with recurrence of prolapse possible.    We will proceed with vaginal hysterectomy with BSO (if able) and an anterior colporrhaphy.  I discussed 1 in 4 chance of not being able to obtain the ovaries and fallopian tubes.  Initially was hesitant to have her ovaries removed but I explained that hormonally they are in active tissue at this point so if able to  remove it is best to do so.  She was in agreement.  We again discussed time away from work/heavy physical activity of up to several weeks for recovery purposes.  I discussed the lifting restrictions and pelvic rest restrictions of at least 6-8 weeks. There are risks of infection, bleeding, possibility of needing a blood transfusion, injury to bowel, bladder, major pelvic vessels, ureter, nerves from positioning and or skin irritation, and deep venous thrombosis. The risk of inadvertent injury to internal organs either immediately recognized or delay recognized necessitating major exploratory reparative surgeries and future reparative surgeries including bowel resection, ostomy formation, bladder repair, ureteral damage repair was discussed with her.  General anesthesia also has risks including myocardial infarction, stroke, and death.  Common scenarios for complications from surgery were reviewed with the patient including focus on bladder and/or ureteral injuries.  Incisional complications to include opening and draining of incisions and closure by secondary intention, dehiscence, and hernia formation were reviewed. Generally the complication rate is less than 1%.  Conversion to laparotomy may be deemed necessary at the time of the procedure, which if performed would result in longer hospital stay, and more postoperative pain and healing time.  Other risks of the surgery to include fistula formation and possible need for future corrective surgeries, risk of persistent dyspareunia of about 15% and there is also possibility of over-correction and narrowing of the vagina.  The procedure can also reveal occult stress urinary incontinence in some cases even if this was not a problem before the surgery.  We discussed potential  for same-day discharge, need for postoperative voiding trial before discharge, possibility of needing to be discharged with Foley urinary catheter in place followed by subsequent removal in the  office.  She acknowledges understanding of all the above and is ready to proceed as scheduled.  All questions were answered by the end of the visit.     Theresia Majors MD 05/14/20

## 2020-05-15 ENCOUNTER — Other Ambulatory Visit (HOSPITAL_COMMUNITY)
Admission: RE | Admit: 2020-05-15 | Discharge: 2020-05-15 | Disposition: A | Discharge status: Home / Self Care | Payer: PRIVATE HEALTH INSURANCE | Source: Ambulatory Visit | Attending: Obstetrics and Gynecology | Admitting: Obstetrics and Gynecology

## 2020-05-15 DIAGNOSIS — Z01812 Encounter for preprocedural laboratory examination: Secondary | ICD-10-CM | POA: Insufficient documentation

## 2020-05-15 DIAGNOSIS — Z20822 Contact with and (suspected) exposure to covid-19: Secondary | ICD-10-CM | POA: Diagnosis not present

## 2020-05-15 LAB — SARS CORONAVIRUS 2 (TAT 6-24 HRS): SARS Coronavirus 2: NEGATIVE

## 2020-05-19 ENCOUNTER — Encounter (HOSPITAL_BASED_OUTPATIENT_CLINIC_OR_DEPARTMENT_OTHER)
Admission: RE | Disposition: A | Discharge status: Home / Self Care | Payer: Self-pay | Source: Home / Self Care | Attending: Obstetrics and Gynecology

## 2020-05-19 ENCOUNTER — Encounter (HOSPITAL_BASED_OUTPATIENT_CLINIC_OR_DEPARTMENT_OTHER): Payer: Self-pay | Admitting: Obstetrics and Gynecology

## 2020-05-19 ENCOUNTER — Ambulatory Visit (HOSPITAL_BASED_OUTPATIENT_CLINIC_OR_DEPARTMENT_OTHER): Payer: PRIVATE HEALTH INSURANCE | Admitting: Anesthesiology

## 2020-05-19 ENCOUNTER — Ambulatory Visit (HOSPITAL_BASED_OUTPATIENT_CLINIC_OR_DEPARTMENT_OTHER)
Admission: RE | Admit: 2020-05-19 | Discharge: 2020-05-19 | Disposition: A | Discharge status: Home / Self Care | Payer: PRIVATE HEALTH INSURANCE | Attending: Obstetrics and Gynecology | Admitting: Obstetrics and Gynecology

## 2020-05-19 DIAGNOSIS — Z8616 Personal history of COVID-19: Secondary | ICD-10-CM | POA: Insufficient documentation

## 2020-05-19 DIAGNOSIS — I10 Essential (primary) hypertension: Secondary | ICD-10-CM | POA: Diagnosis not present

## 2020-05-19 DIAGNOSIS — M19041 Primary osteoarthritis, right hand: Secondary | ICD-10-CM | POA: Insufficient documentation

## 2020-05-19 DIAGNOSIS — Z87891 Personal history of nicotine dependence: Secondary | ICD-10-CM | POA: Diagnosis not present

## 2020-05-19 DIAGNOSIS — N812 Incomplete uterovaginal prolapse: Secondary | ICD-10-CM | POA: Diagnosis not present

## 2020-05-19 DIAGNOSIS — K219 Gastro-esophageal reflux disease without esophagitis: Secondary | ICD-10-CM | POA: Insufficient documentation

## 2020-05-19 DIAGNOSIS — Z79899 Other long term (current) drug therapy: Secondary | ICD-10-CM | POA: Insufficient documentation

## 2020-05-19 DIAGNOSIS — N841 Polyp of cervix uteri: Secondary | ICD-10-CM | POA: Insufficient documentation

## 2020-05-19 DIAGNOSIS — N888 Other specified noninflammatory disorders of cervix uteri: Secondary | ICD-10-CM

## 2020-05-19 DIAGNOSIS — N814 Uterovaginal prolapse, unspecified: Secondary | ICD-10-CM

## 2020-05-19 DIAGNOSIS — Z791 Long term (current) use of non-steroidal anti-inflammatories (NSAID): Secondary | ICD-10-CM | POA: Insufficient documentation

## 2020-05-19 DIAGNOSIS — N8110 Cystocele, unspecified: Secondary | ICD-10-CM

## 2020-05-19 DIAGNOSIS — M19042 Primary osteoarthritis, left hand: Secondary | ICD-10-CM | POA: Diagnosis not present

## 2020-05-19 HISTORY — PX: CYSTOCELE REPAIR: SHX163

## 2020-05-19 HISTORY — PX: CYSTOSCOPY: SHX5120

## 2020-05-19 HISTORY — PX: OOPHORECTOMY: SHX6387

## 2020-05-19 HISTORY — PX: VAGINAL HYSTERECTOMY: SHX2639

## 2020-05-19 LAB — TYPE AND SCREEN
ABO/RH(D): O POS
Antibody Screen: NEGATIVE

## 2020-05-19 SURGERY — HYSTERECTOMY, VAGINAL
Anesthesia: General | Site: Vagina

## 2020-05-19 MED ORDER — LACTATED RINGERS IV SOLN: INTRAVENOUS | Status: DC

## 2020-05-19 MED ORDER — OXYCODONE HCL 5 MG PO TABS
5.0000 mg | ORAL_TABLET | Freq: Once | ORAL | Status: AC | PRN
  Administered 2020-05-19: 5 mg via ORAL

## 2020-05-19 MED ORDER — HYDROMORPHONE HCL 1 MG/ML IJ SOLN
0.2500 mg | INTRAMUSCULAR | Status: DC | PRN
  Administered 2020-05-19 (×2): 0.25 mg via INTRAVENOUS

## 2020-05-19 MED ORDER — KETOROLAC TROMETHAMINE 15 MG/ML IJ SOLN
INTRAMUSCULAR | Status: AC
  Filled 2020-05-19: qty 1

## 2020-05-19 MED ORDER — ROCURONIUM BROMIDE 10 MG/ML (PF) SYRINGE
PREFILLED_SYRINGE | INTRAVENOUS | Status: AC
  Filled 2020-05-19: qty 10

## 2020-05-19 MED ORDER — CEFAZOLIN SODIUM-DEXTROSE 2-4 GM/100ML-% IV SOLN
INTRAVENOUS | Status: AC
  Filled 2020-05-19: qty 100

## 2020-05-19 MED ORDER — DOCUSATE SODIUM 100 MG PO CAPS
100.0000 mg | ORAL_CAPSULE | Freq: Two times a day (BID) | ORAL | Status: DC
Start: 2020-05-19 — End: 2020-07-14

## 2020-05-19 MED ORDER — FENTANYL CITRATE (PF) 100 MCG/2ML IJ SOLN
INTRAMUSCULAR | Status: DC | PRN
  Administered 2020-05-19 (×2): 50 ug via INTRAVENOUS

## 2020-05-19 MED ORDER — ONDANSETRON HCL 4 MG/2ML IJ SOLN
INTRAMUSCULAR | Status: AC
  Filled 2020-05-19: qty 2

## 2020-05-19 MED ORDER — ONDANSETRON HCL 4 MG/2ML IJ SOLN
INTRAMUSCULAR | Status: DC | PRN
  Administered 2020-05-19: 4 mg via INTRAVENOUS

## 2020-05-19 MED ORDER — ESTRADIOL 0.1 MG/GM VA CREA
TOPICAL_CREAM | VAGINAL | Status: AC
  Filled 2020-05-19: qty 42.5

## 2020-05-19 MED ORDER — OXYCODONE HCL 5 MG/5ML PO SOLN: 5.0000 mg | Freq: Once | ORAL | Status: AC | PRN

## 2020-05-19 MED ORDER — OXYCODONE HCL 5 MG PO TABS
ORAL_TABLET | ORAL | Status: AC
  Filled 2020-05-19: qty 1

## 2020-05-19 MED ORDER — ONDANSETRON 4 MG PO TBDP
ORAL_TABLET | ORAL | Status: AC
  Filled 2020-05-19: qty 1

## 2020-05-19 MED ORDER — SUGAMMADEX SODIUM 200 MG/2ML IV SOLN
INTRAVENOUS | Status: DC | PRN
  Administered 2020-05-19: 200 mg via INTRAVENOUS

## 2020-05-19 MED ORDER — PHENYLEPHRINE 40 MCG/ML (10ML) SYRINGE FOR IV PUSH (FOR BLOOD PRESSURE SUPPORT)
PREFILLED_SYRINGE | INTRAVENOUS | Status: DC | PRN
  Administered 2020-05-19 (×3): 80 ug via INTRAVENOUS

## 2020-05-19 MED ORDER — ACETAMINOPHEN 325 MG PO TABS: 325.0000 mg | ORAL_TABLET | Freq: Once | ORAL | Status: DC | PRN

## 2020-05-19 MED ORDER — ACETAMINOPHEN 160 MG/5ML PO SOLN: 325.0000 mg | Freq: Once | ORAL | Status: DC | PRN

## 2020-05-19 MED ORDER — ROCURONIUM BROMIDE 10 MG/ML (PF) SYRINGE
PREFILLED_SYRINGE | INTRAVENOUS | Status: DC | PRN
Start: 2020-05-19 — End: 2020-05-19
  Administered 2020-05-19: 20 mg via INTRAVENOUS
  Administered 2020-05-19: 50 mg via INTRAVENOUS

## 2020-05-19 MED ORDER — ACETAMINOPHEN 500 MG PO TABS
1000.0000 mg | ORAL_TABLET | Freq: Four times a day (QID) | ORAL | Status: DC
  Administered 2020-05-19 (×2): 1000 mg via ORAL

## 2020-05-19 MED ORDER — MEPERIDINE HCL 25 MG/ML IJ SOLN: 6.2500 mg | INTRAMUSCULAR | Status: DC | PRN

## 2020-05-19 MED ORDER — ONDANSETRON HCL 4 MG PO TABS
4.0000 mg | ORAL_TABLET | Freq: Four times a day (QID) | ORAL | Status: DC | PRN
  Administered 2020-05-19: 4 mg via ORAL

## 2020-05-19 MED ORDER — AMISULPRIDE (ANTIEMETIC) 5 MG/2ML IV SOLN: 10.0000 mg | Freq: Once | INTRAVENOUS | Status: DC | PRN

## 2020-05-19 MED ORDER — BUPIVACAINE-EPINEPHRINE 0.25% -1:200000 IJ SOLN
INTRAMUSCULAR | Status: DC | PRN
  Administered 2020-05-19: 4 mL
  Administered 2020-05-19: 10 mL

## 2020-05-19 MED ORDER — OXYCODONE HCL 5 MG PO TABS: 5.0000 mg | ORAL_TABLET | Freq: Four times a day (QID) | ORAL | 0 refills | Status: DC | PRN

## 2020-05-19 MED ORDER — FENTANYL CITRATE (PF) 100 MCG/2ML IJ SOLN
INTRAMUSCULAR | Status: AC
  Filled 2020-05-19: qty 2

## 2020-05-19 MED ORDER — ALUM & MAG HYDROXIDE-SIMETH 200-200-20 MG/5ML PO SUSP: 30.0000 mL | ORAL | Status: DC | PRN

## 2020-05-19 MED ORDER — HYDROMORPHONE HCL 1 MG/ML IJ SOLN
INTRAMUSCULAR | Status: AC
  Filled 2020-05-19: qty 1

## 2020-05-19 MED ORDER — ACETAMINOPHEN 500 MG PO TABS
ORAL_TABLET | ORAL | Status: AC
  Filled 2020-05-19: qty 2

## 2020-05-19 MED ORDER — PROPOFOL 10 MG/ML IV BOLUS
INTRAVENOUS | Status: DC | PRN
  Administered 2020-05-19: 50 mg via INTRAVENOUS
  Administered 2020-05-19: 150 mg via INTRAVENOUS
  Administered 2020-05-19: 50 mg via INTRAVENOUS

## 2020-05-19 MED ORDER — HYDROMORPHONE HCL 1 MG/ML IJ SOLN: 0.2000 mg | INTRAMUSCULAR | Status: DC | PRN

## 2020-05-19 MED ORDER — MIDAZOLAM HCL 5 MG/5ML IJ SOLN
INTRAMUSCULAR | Status: DC | PRN
  Administered 2020-05-19: 2 mg via INTRAVENOUS

## 2020-05-19 MED ORDER — BUPIVACAINE-EPINEPHRINE 0.25% -1:200000 IJ SOLN: 20.0000 mL | Freq: Once | INTRAMUSCULAR | Status: DC

## 2020-05-19 MED ORDER — CEFAZOLIN SODIUM-DEXTROSE 2-4 GM/100ML-% IV SOLN
2.0000 g | INTRAVENOUS | Status: AC
  Administered 2020-05-19: 2 g via INTRAVENOUS

## 2020-05-19 MED ORDER — ESTRADIOL 0.1 MG/GM VA CREA: TOPICAL_CREAM | VAGINAL | Status: DC | PRN

## 2020-05-19 MED ORDER — ONDANSETRON HCL 4 MG/2ML IJ SOLN: 4.0000 mg | Freq: Four times a day (QID) | INTRAMUSCULAR | Status: DC | PRN

## 2020-05-19 MED ORDER — DEXAMETHASONE SODIUM PHOSPHATE 10 MG/ML IJ SOLN
INTRAMUSCULAR | Status: AC
  Filled 2020-05-19: qty 1

## 2020-05-19 MED ORDER — MIDAZOLAM HCL 2 MG/2ML IJ SOLN
INTRAMUSCULAR | Status: AC
  Filled 2020-05-19: qty 2

## 2020-05-19 MED ORDER — LIDOCAINE 2% (20 MG/ML) 5 ML SYRINGE
INTRAMUSCULAR | Status: DC | PRN
  Administered 2020-05-19: 100 mg via INTRAVENOUS

## 2020-05-19 MED ORDER — DEXAMETHASONE SODIUM PHOSPHATE 4 MG/ML IJ SOLN
INTRAMUSCULAR | Status: DC | PRN
  Administered 2020-05-19: 10 mg via INTRAVENOUS

## 2020-05-19 MED ORDER — SIMETHICONE 80 MG PO CHEW: 80.0000 mg | CHEWABLE_TABLET | Freq: Four times a day (QID) | ORAL | Status: DC | PRN

## 2020-05-19 MED ORDER — ACETAMINOPHEN 500 MG PO TABS
1000.0000 mg | ORAL_TABLET | Freq: Four times a day (QID) | ORAL | Status: AC
Start: 2020-05-19 — End: 2020-05-24

## 2020-05-19 MED ORDER — LIDOCAINE 2% (20 MG/ML) 5 ML SYRINGE
INTRAMUSCULAR | Status: AC
  Filled 2020-05-19: qty 5

## 2020-05-19 MED ORDER — ACETAMINOPHEN 10 MG/ML IV SOLN: 1000.0000 mg | Freq: Once | INTRAVENOUS | Status: DC | PRN

## 2020-05-19 MED ORDER — EPHEDRINE SULFATE-NACL 50-0.9 MG/10ML-% IV SOSY
PREFILLED_SYRINGE | INTRAVENOUS | Status: DC | PRN
  Administered 2020-05-19: 5 mg via INTRAVENOUS
  Administered 2020-05-19: 10 mg via INTRAVENOUS

## 2020-05-19 MED ORDER — KETOROLAC TROMETHAMINE 30 MG/ML IJ SOLN
15.0000 mg | Freq: Four times a day (QID) | INTRAMUSCULAR | Status: DC
  Administered 2020-05-19 (×2): 15 mg via INTRAVENOUS

## 2020-05-19 MED ORDER — OXYCODONE HCL 5 MG PO TABS
5.0000 mg | ORAL_TABLET | ORAL | Status: DC | PRN
  Administered 2020-05-19: 5 mg via ORAL

## 2020-05-19 SURGICAL SUPPLY — 71 items
APPLICATOR ARISTA FLEXITIP XL (MISCELLANEOUS) IMPLANT
ATTRACTOMAT 16X20 MAGNETIC DRP (DRAPES) ×5 IMPLANT
BARRIER ADHS 3X4 INTERCEED (GAUZE/BANDAGES/DRESSINGS) IMPLANT
BLADE SURG 15 STRL LF DISP TIS (BLADE) IMPLANT
BLADE SURG 15 STRL SS (BLADE)
CABLE HIGH FREQUENCY MONO STRZ (ELECTRODE) IMPLANT
COVER BACK TABLE 60X90IN (DRAPES) IMPLANT
COVER MAYO STAND STRL (DRAPES) ×10 IMPLANT
COVER WAND RF STERILE (DRAPES) ×5 IMPLANT
DECANTER SPIKE VIAL GLASS SM (MISCELLANEOUS) ×5 IMPLANT
DERMABOND ADVANCED (GAUZE/BANDAGES/DRESSINGS)
DERMABOND ADVANCED .7 DNX12 (GAUZE/BANDAGES/DRESSINGS) IMPLANT
DRAPE HYSTEROSCOPY (DRAPE) ×5 IMPLANT
DRAPE SHEET LG 3/4 BI-LAMINATE (DRAPES) ×10 IMPLANT
DRAPE UTILITY XL STRL (DRAPES) ×5 IMPLANT
DURAPREP 26ML APPLICATOR (WOUND CARE) IMPLANT
ELECT REM PT RETURN 9FT ADLT (ELECTROSURGICAL) ×5
ELECTRODE REM PT RTRN 9FT ADLT (ELECTROSURGICAL) ×4 IMPLANT
GAUZE 4X4 16PLY RFD (DISPOSABLE) ×15 IMPLANT
GLOVE BIO SURGEON STRL SZ 6.5 (GLOVE) ×10 IMPLANT
GLOVE BIO SURGEON STRL SZ7.5 (GLOVE) ×5 IMPLANT
GLOVE BIO SURGEON STRL SZ8 (GLOVE) ×5 IMPLANT
GLOVE BIOGEL PI IND STRL 7.0 (GLOVE) ×8 IMPLANT
GLOVE BIOGEL PI IND STRL 8 (GLOVE) ×4 IMPLANT
GLOVE BIOGEL PI INDICATOR 7.0 (GLOVE) ×2
GLOVE BIOGEL PI INDICATOR 8 (GLOVE) ×1
GOWN STRL REUS W/ TWL LRG LVL3 (GOWN DISPOSABLE) ×8 IMPLANT
GOWN STRL REUS W/TWL LRG LVL3 (GOWN DISPOSABLE) ×2 IMPLANT
GOWN STRL REUS W/TWL XL LVL3 (GOWN DISPOSABLE) ×10 IMPLANT
HEMOSTAT ARISTA ABSORB 3G PWDR (HEMOSTASIS) IMPLANT
KIT TURNOVER CYSTO (KITS) ×5 IMPLANT
LIGASURE IMPACT 36 18CM CVD LR (INSTRUMENTS) ×5 IMPLANT
LIGASURE VESSEL 5MM BLUNT TIP (ELECTROSURGICAL) IMPLANT
NEEDLE HYPO 22GX1.5 SAFETY (NEEDLE) ×5 IMPLANT
NS IRRIG 500ML POUR BTL (IV SOLUTION) ×5 IMPLANT
PACK LAVH (CUSTOM PROCEDURE TRAY) IMPLANT
PACK TRENDGUARD 450 HYBRID PRO (MISCELLANEOUS) ×4 IMPLANT
PACK VAGINAL WOMENS (CUSTOM PROCEDURE TRAY) ×5 IMPLANT
PROTECTOR NERVE ULNAR (MISCELLANEOUS) IMPLANT
SCISSORS LAP 5X35 DISP (ENDOMECHANICALS) IMPLANT
SET CYSTO W/LG BORE CLAMP LF (SET/KITS/TRAYS/PACK) ×5 IMPLANT
SET IRRIG Y TYPE TUR BLADDER L (SET/KITS/TRAYS/PACK) ×5 IMPLANT
SET SUCTION IRRIG HYDROSURG (IRRIGATION / IRRIGATOR) IMPLANT
SET TUBE SMOKE EVAC HIGH FLOW (TUBING) IMPLANT
SLEEVE SURGEON STRL (DRAPES) ×5 IMPLANT
SUT MNCRL AB 4-0 PS2 18 (SUTURE) IMPLANT
SUT VIC AB 0 CT1 18XCR BRD8 (SUTURE) ×8 IMPLANT
SUT VIC AB 0 CT1 27 (SUTURE) ×2
SUT VIC AB 0 CT1 27XBRD ANBCTR (SUTURE) ×8 IMPLANT
SUT VIC AB 0 CT1 8-18 (SUTURE) ×2
SUT VIC AB 2-0 CT1 27 (SUTURE)
SUT VIC AB 2-0 CT1 TAPERPNT 27 (SUTURE) IMPLANT
SUT VIC AB 2-0 CT2 27 (SUTURE) IMPLANT
SUT VIC AB 2-0 SH 27 (SUTURE) ×2
SUT VIC AB 2-0 SH 27XBRD (SUTURE) ×8 IMPLANT
SUT VIC AB 2-0 UR5 27 (SUTURE) IMPLANT
SUT VIC AB 3-0 SH 27 (SUTURE)
SUT VIC AB 3-0 SH 27X BRD (SUTURE) IMPLANT
SUT VIC AB 4-0 SH 27 (SUTURE)
SUT VIC AB 4-0 SH 27XANBCTRL (SUTURE) IMPLANT
SUT VICRYL 0 ENDOLOOP (SUTURE) ×10 IMPLANT
SUT VICRYL 0 TIES 12 18 (SUTURE) IMPLANT
SUT VICRYL 0 UR6 27IN ABS (SUTURE) IMPLANT
SUT VICRYL 2 0 18  UND BR (SUTURE)
SUT VICRYL 2 0 18 UND BR (SUTURE) IMPLANT
TOWEL OR 17X26 10 PK STRL BLUE (TOWEL DISPOSABLE) ×5 IMPLANT
TRAY FOLEY W/BAG SLVR 14FR (SET/KITS/TRAYS/PACK) ×5 IMPLANT
TRENDGUARD 450 HYBRID PRO PACK (MISCELLANEOUS) ×5
TROCAR BLADELESS OPT 5 100 (ENDOMECHANICALS) IMPLANT
WARMER LAPAROSCOPE (MISCELLANEOUS) IMPLANT
YANKAUER SUCT BULB TIP NO VENT (SUCTIONS) ×5 IMPLANT

## 2020-05-19 NOTE — Progress Notes (Signed)
Pain documentation for 1043 and 1051 by Leandro Reasoner, RN

## 2020-05-19 NOTE — Anesthesia Preprocedure Evaluation (Addendum)
Anesthesia Evaluation  Patient identified by MRN, date of birth, ID band Patient awake    Reviewed: Allergy & Precautions, NPO status , Patient's Chart, lab work & pertinent test results  Airway Mallampati: II  TM Distance: >3 FB Neck ROM: Full    Dental  (+) Teeth Intact, Dental Advisory Given   Pulmonary former smoker,    breath sounds clear to auscultation       Cardiovascular hypertension, Pt. on medications and Pt. on home beta blockers  Rhythm:Regular Rate:Normal     Neuro/Psych negative neurological ROS  negative psych ROS   GI/Hepatic Neg liver ROS, hiatal hernia, GERD  Medicated,  Endo/Other  Hyperthyroidism   Renal/GU negative Renal ROS     Musculoskeletal  (+) Arthritis ,   Abdominal Normal abdominal exam  (+)   Peds  Hematology negative hematology ROS (+)   Anesthesia Other Findings   Reproductive/Obstetrics                            Anesthesia Physical Anesthesia Plan  ASA: II  Anesthesia Plan: General   Post-op Pain Management:    Induction: Intravenous  PONV Risk Score and Plan: 4 or greater and Ondansetron, Dexamethasone, Midazolam and Scopolamine patch - Pre-op  Airway Management Planned: Oral ETT  Additional Equipment: None  Intra-op Plan:   Post-operative Plan: Extubation in OR  Informed Consent: I have reviewed the patients History and Physical, chart, labs and discussed the procedure including the risks, benefits and alternatives for the proposed anesthesia with the patient or authorized representative who has indicated his/her understanding and acceptance.     Dental advisory given  Plan Discussed with: CRNA  Anesthesia Plan Comments: (Lab Results      Component                Value               Date                      WBC                      8.5                 05/13/2020                HGB                      14.1                05/13/2020                 HCT                      42.5                05/13/2020                MCV                      89.5                05/13/2020                PLT                      289  05/13/2020           )       Anesthesia Quick Evaluation

## 2020-05-19 NOTE — Brief Op Note (Signed)
05/19/2020  10:08 AM  PATIENT:  Courtney Richard  69 y.o. female  PRE-OPERATIVE DIAGNOSIS:  pelvic organ prolapse with cystocele and uterine prolapse  POST-OPERATIVE DIAGNOSIS:  pelvic organ prolapse with cystocele and uterine prolapse  PROCEDURE:  Procedure(s) with comments: HYSTERECTOMY VAGINAL (N/A) - request 7:30am OR time.  Requests 2.5 hours ANTERIOR REPAIR (CYSTOCELE) (N/A) VAGINAL HYSTERECTOMY BILATERAL SAPINGO-OOPHORECTOMY (Bilateral) CYSTOSCOPY (N/A)  SURGEON:  Surgeon(s) and Role:    * Theresia Majors, MD - Primary    * Genia Del, MD - Assisting  ANESTHESIA:   local and general  EBL:  100 mL   BLOOD ADMINISTERED:none  DRAINS: none   LOCAL MEDICATIONS USED:  BUPIVICAINE w epinephrine  SPECIMEN:  Source of Specimen:  uterus, cervix, bilateral fallopian tubes and ovaries  DISPOSITION OF SPECIMEN:  PATHOLOGY  COUNTS:  YES  TOURNIQUET:  * No tourniquets in log *  DICTATION: .Note written in EPIC  PLAN OF CARE: Discharge to home after PACU/extended recovery/same day discharge if able  PATIENT DISPOSITION:  PACU - hemodynamically stable.   Delay start of Pharmacological VTE agent (>24hrs) due to surgical blood loss or risk of bleeding: not applicable

## 2020-05-19 NOTE — H&P (Signed)
   Courtney Richard 04-07-1951 MRN: [id number]  HPI The patient is a 69 y.o. G3P3 who presents today for scheduled vaginal hysterectomy with bilateral salpingo oophorectomy and anterior colporrhaphy for pelvic organ prolapse .  No changes to her medical history since her pre op exam, denies CP, SOB, fever/chills, dysuria. Spanish interpreter is present for the preoperative discussion.  Past Medical History:  Diagnosis Date  . Arthritis    hands  . GERD (gastroesophageal reflux disease)   . Hiatal hernia   . History of 2019 novel coronavirus disease (COVID-19) 04/09/2019   hospitalized in epic  . Hypertension   . Hyperthyroidism   . Pneumonia 04/14/2019   Past Surgical History:  Procedure Laterality Date  . COLONOSCOPY  2005   No current facility-administered medications on file prior to encounter.   Current Outpatient Medications on File Prior to Encounter  Medication Sig Dispense Refill  . cholecalciferol (VITAMIN D3) 25 MCG (1000 UNIT) tablet Take 1,000 Units by mouth daily.    . metoprolol succinate (TOPROL-XL) 25 MG 24 hr tablet Take 25 mg by mouth daily.    . famotidine (PEPCID) 20 mg Inject 20 mg into the vein every 12 (twelve) hours.     . meloxicam (MOBIC) 15 MG tablet Take 15 mg by mouth daily.       No Known Allergies    Physical Exam   BP (!) 163/79   Pulse 75   Temp (!) 97.3 F (36.3 C) (Oral)   Resp 14   Ht 5\' 6"  (1.676 m)   Wt 70.5 kg   SpO2 100%   BMI 25.08 kg/m    General: Pleasant female, no acute distress, alert and oriented CV: RRR, no murmurs Pulm: good respiratory effort, CTAB     Plan Proceed with vaginal hysterectomy with BSO and anterior colporrhaphy as planned.  We reviewed post operative recovery expectations, voiding trial in post op, same day discharge possible.  All questions answered and the patient agrees to proceed.    , MD 05/19/20

## 2020-05-19 NOTE — Anesthesia Postprocedure Evaluation (Signed)
Anesthesia Post Note  Patient: Courtney Richard  Procedure(s) Performed: HYSTERECTOMY VAGINAL (N/A Vagina ) ANTERIOR REPAIR (CYSTOCELE) (N/A Vagina ) VAGINAL BILATERAL SAPINGO-OOPHORECTOMY (Bilateral Vagina ) CYSTOSCOPY (N/A Bladder)     Patient location during evaluation: PACU Anesthesia Type: General Level of consciousness: awake and alert Pain management: pain level controlled Vital Signs Assessment: post-procedure vital signs reviewed and stable Respiratory status: spontaneous breathing, nonlabored ventilation, respiratory function stable and patient connected to nasal cannula oxygen Cardiovascular status: blood pressure returned to baseline and stable Postop Assessment: no apparent nausea or vomiting Anesthetic complications: no   No complications documented.  Last Vitals:  Vitals:   05/19/20 1130 05/19/20 1145  BP: 109/60 (!) 109/58  Pulse: 69 73  Resp: 16 15  Temp:  37 C  SpO2: 94% 96%    L       Shelton Silvas

## 2020-05-19 NOTE — Progress Notes (Signed)
Patient was able to void all criteria is met for discharge.

## 2020-05-19 NOTE — Transfer of Care (Signed)
Immediate Anesthesia Transfer of Care Note  Patient: Courtney Richard  Procedure(s) Performed: HYSTERECTOMY VAGINAL (N/A Vagina ) ANTERIOR REPAIR (CYSTOCELE) (N/A Vagina ) VAGINAL BILATERAL SAPINGO-OOPHORECTOMY (Bilateral Vagina ) CYSTOSCOPY (N/A Bladder)  Patient Location: PACU  Anesthesia Type:General  Level of Consciousness: drowsy  Airway & Oxygen Therapy: Patient Spontanous Breathing and Patient connected to face mask oxygen  Post-op Assessment: Report given to RN and Post -op Vital signs reviewed and stable  Post vital signs: Reviewed and stable  Last Vitals:  Vitals Value Taken Time  BP 105/61 05/19/20 1004  Temp 36.8 C 05/19/20 1004  Pulse 78 05/19/20 1005  Resp 20 05/19/20 1005  SpO2 97 % 05/19/20 1005  Vitals shown include unvalidated device data.  Last Pain:  Vitals:   05/19/20 0556  TempSrc: Oral  PainSc: 0-No pain      Patients Stated Pain Goal: 5 (05/19/20 0556)  Complications: No complications documented.

## 2020-05-19 NOTE — Anesthesia Procedure Notes (Signed)
Procedure Name: Intubation Date/Time: 05/19/2020 7:30 AM Performed by: Caren Macadam, CRNA Pre-anesthesia Checklist: Patient identified, Emergency Drugs available, Suction available and Patient being monitored Patient Re-evaluated:Patient Re-evaluated prior to induction Oxygen Delivery Method: Circle system utilized Preoxygenation: Pre-oxygenation with 100% oxygen Induction Type: IV induction Ventilation: Mask ventilation without difficulty Laryngoscope Size: Miller and 2 Grade View: Grade I Tube type: Oral Tube size: 7.0 mm Number of attempts: 1 Airway Equipment and Method: Stylet Placement Confirmation: ETT inserted through vocal cords under direct vision,  positive ETCO2 and breath sounds checked- equal and bilateral Secured at: 22 cm Tube secured with: Tape Dental Injury: Teeth and Oropharynx as per pre-operative assessment

## 2020-05-20 ENCOUNTER — Encounter (HOSPITAL_BASED_OUTPATIENT_CLINIC_OR_DEPARTMENT_OTHER): Payer: Self-pay | Admitting: Obstetrics and Gynecology

## 2020-05-20 LAB — SURGICAL PATHOLOGY

## 2020-05-20 NOTE — Op Note (Signed)
Name: Courtney MARIA MAGDALENA Dottavio  Age: 69 y.o.  Date of Birth: 11/20/1950  Medical Record #: [id number]  Operative Note  Preoperative Diagnosis: Pelvic organ prolapse with cystocele and uterine prolapse Procedure: Vaginal hysterectomy, bilateral salpingo-oophorectomy, anterior colporrhaphy, cystoscopy Postoperative Diagnosis: same Surgeon: Theresia Majors, MD  Assistant: Genia Del, MD Estimated Blood Loss: 100 mL Anesthesia: General, local with 0.25% bupivacaine with 1:200,000 epinephrine Specimen: Uterus, cervix, bilateral fallopian tubes and ovaries Findings: Grade 2 prolapsed 6 week sized uterus, normal bilateral fallopian tubes, normal appearing bilateral ovaries, 2 cm simple cyst on right ovary.  Grade 2-3 cystocele Complications: None. Date: 05/20/20  Under general anesthesia, Courtney Richard was placed in the dorsolithotomy position in candy cane stirrups, maintaining flexion of knee and hip joints at no greater than 90 degrees.  SCDs were applied and functioning.  She underwent bimanual exam and was prepped and draped in the usual sterile fashion. A surgical team time out was performed to verify and agree on procedure and patient consent. An urinary catheter was placed productive of clear urine.  Two grams of IV cefazolin were administered.  A weighted speculum was placed in the posterior vagina.  The cervix grasped with two single-toothed tenacula.  The cervical-vaginal interface was injected in a circumferential manner with local anesthetic.  A circumferential incision was made around the cervix with the scalpel. The anterior and posterior cul-de-sacs were entered sharply without difficulty. The uterosacral ligaments were clamped with a Heaney clamp, cut and suture ligated with #0 Vicryl.  The remaining cardinal and broad ligament pedicles were clamped, ligated, and cut with the use of the LigaSure Impact. The uterus was inverted and the remaining uteroovarian  ligament and round ligament complex and then the cornual attachment of the fallopian tubes were clamped, double fulgurated, and cut with the Ligasure.  The uterus and cervix were removed from the pelvic cavity, and was palpably free of the ureter.  The bowel and omentum were packed out of the operative field with a moist laparotomy sponge. The right fallopian tube and ovary were grasped with an Allis clamp together.  A 2 cm simple cyst on the right ovary was ruptured in the process, releasing a clear straw colored fluid contents.  The right infundibulopelvic ligament was tied off with #0 Vicryl Endoloop to secure the pedicle, and then the right IP ligament was clamped, and fulgurated twice, then cut with the Ligasure, staying a safe distance from the pelvic side wall.  This was repeated to remove the left ovary and fallopian tube.  The uterus, cervix, and bilateral fallopian tubes and ovaries were sent to pathology for review.   The posterior peritoneum was sutured with a running locking stitch to the posterior vaginal cuff using #0 Vicryl suture to ensure hemostasis.  The pelvic cavity appeared dry and hemostatic.  The left uterosacral ligament was identified, and the left ureter was palpated well away from the ligament.  A #0 Vicryl suture was placed around the left uterosacral ligament and the posterior peritoneum was purse stringed over the needle.  The right uterosacral ligament and ureter were palpated and the suture was placed around the right uterosacral ligament, and the suture tails from both uterosacral ligaments were tied together to provide apical suspension.   Laparotomy sponge was removed from the abdomen. The anterior vaginal cuff was everted and grasped with Allis clamp, and the vaginal cuff was closed in a vertical fashion using interrupted figure-of-eight stitches with #0 Vicryl.  Attention was directed to the  cystocele.  The urine in the catheter remained clear.  The vaginal apex was grasped  with two Allis clamps.  A marker was used to outline the proposed area of vaginal epithelium excision.  Starting from the vaginal cuff closure and moving anteriorly, the anterior vaginal epithelium was hydrodissected off of the underlying vesicovaginal fascia with about 7 mL of local anesthetic.  The vaginal epithelium was dissected free from the underlining fascia sharply with the Metzenbaum scissors and bluntly, progressing superiorly using Allis forceps for traction. The bladder fascia was identified overlying the bladder and no invasion into the bladder epithelium occurred. The dissection was palpably free of the ureter. The fascia was plicated in the midline in a typical Kelly-Kennedy fashion using #2-0 Vicryl suture. Reapproximation of the urethrovesicle angle was accomplished without excess traction. Using interrupted #2-0 Vicryl suture, the remaining fascia was plicated progressing inferiorly toward the vaginal apex. Excess vaginal epithelium was trimmed away and discarded. The vaginal epithelium was reapproximated in the midline using interrupted figure-of-eight stitches with #0 Vicryl.  The Foley catheter was removed.  A cystoscopy was performed using a 30 degree cystoscope.  The bladder was instilled with 300 mL of normal saline.  Neither misplaced suture nor tissue trauma was visible in the bladder.  Vigorous efflux of clear urine from the bilateral ureteral orifices was visualized.  The bladder was then drained of contents and the urethral area was cleansed with betadine and the Foley catheter was reinserted.  Instrument, sponge, and needle counts were correct at closure of peritoneum and also at the conclusion of the procedure. The patient tolerated the procedure well and she was taken to the recovery room in stable condition.   The surgical assist was present for the full case and was needed to assist with retraction and visualization of surgical anatomy.    Theresia Majors, M.D.

## 2020-05-22 ENCOUNTER — Telehealth: Payer: Self-pay

## 2020-05-22 NOTE — Telephone Encounter (Signed)
Patient came by and signed med rec release form. I called [facility] and confirmed her surgery date and procedure. Debarah Crape did advise her if forms are sent from disability co. There is a $25 fee for initial set of forms. She wanted to wait and see if they send forms before paying.

## 2020-05-22 NOTE — Telephone Encounter (Signed)
Courtney Richard spoke with patient. She said she will come by today or tomorrow to fill out form.

## 2020-05-22 NOTE — Telephone Encounter (Signed)
Patient's disability co called wanting to confirm her surgery and what type surgery she had.  I do not have signed medical records release form on file. I sent staff message to Debarah Crape asking her to contact patient to instruct her.

## 2020-06-13 ENCOUNTER — Encounter: Payer: Self-pay | Admitting: Obstetrics and Gynecology

## 2020-06-13 ENCOUNTER — Ambulatory Visit (INDEPENDENT_AMBULATORY_CARE_PROVIDER_SITE_OTHER): Payer: PRIVATE HEALTH INSURANCE | Admitting: Obstetrics and Gynecology

## 2020-06-13 ENCOUNTER — Other Ambulatory Visit: Payer: Self-pay

## 2020-06-13 ENCOUNTER — Ambulatory Visit: Payer: PRIVATE HEALTH INSURANCE | Admitting: Obstetrics & Gynecology

## 2020-06-13 VITALS — BP 122/78

## 2020-06-13 DIAGNOSIS — N898 Other specified noninflammatory disorders of vagina: Secondary | ICD-10-CM | POA: Diagnosis not present

## 2020-06-13 DIAGNOSIS — N760 Acute vaginitis: Secondary | ICD-10-CM

## 2020-06-13 DIAGNOSIS — B9689 Other specified bacterial agents as the cause of diseases classified elsewhere: Secondary | ICD-10-CM | POA: Diagnosis not present

## 2020-06-13 DIAGNOSIS — Z90710 Acquired absence of both cervix and uterus: Secondary | ICD-10-CM

## 2020-06-13 LAB — WET PREP FOR TRICH, YEAST, CLUE

## 2020-06-13 MED ORDER — METRONIDAZOLE 0.75 % VA GEL
1.0000 | Freq: Every day | VAGINAL | 0 refills | Status: AC
Start: 2020-06-13 — End: 2020-06-18

## 2020-06-13 NOTE — Progress Notes (Signed)
   Courtney Richard 25-Oct-1950 [id number]   REASON FOR APPOINTMENT Chief Complaint  Patient presents with  . Post Op check    Interpreter Rene Kocher Allen-Vaginal discharge     SUBJECTIVE  69 y.o. G3P3 female presents for a routine 3 week post-operative follow up after a vaginal hysterectomy with bilateral salpingo-oophorectomy and anterior colporrhaphy with cystoscopy performed on 05/19/2020 for symptomatic pelvic organ prolapse with cystocele and uterine prolapse.  The patient is doing well with no concerns.  She denies vaginal bleeding.  She is noticing some vaginal discharge.  She is tolerating normal diet.  Bowel and bladder function are normal.  Pain is minimal at this point.  Professional Spanish interpreter present.  OBJECTIVE  BP 122/78    PHYSICAL EXAM General:  Patient in no acute distress.  Abdomen: Soft, non tender, non-distended. Pelvic: Normal vaginal mucosa, no bleeding.  Speculum exam gently performed.  Suture line intact and well-healing.  Scant yellowish discharge without notable odor. Vaginal wet mount + clue cells, many WBC, numerous bacteria, normal epithelial cells   ASSESSMENT / PLAN  Routine 2 week post operative check following TVH BSO anterior repair, diagnosed with bacterial vaginosis  The patient is doing well and meeting all postoperative milestones.  I encouraged her to continue following activity restrictions which we reviewed today. For the BV, I offered her treatment of oral versus vaginal antibiotic.  We will put her on MetroGel 0.75% intravaginal application nightly x5 nights encouraged her to be careful with not inserting too deeply into the vagina as to stress the cuff closure. RTC 4 weeks for final postoperative visit or sooner if any problems before then.   Theresia Majors MD 06/13/20

## 2020-07-14 ENCOUNTER — Ambulatory Visit (INDEPENDENT_AMBULATORY_CARE_PROVIDER_SITE_OTHER): Payer: PRIVATE HEALTH INSURANCE | Admitting: Obstetrics and Gynecology

## 2020-07-14 ENCOUNTER — Encounter: Payer: Self-pay | Admitting: Obstetrics and Gynecology

## 2020-07-14 ENCOUNTER — Other Ambulatory Visit: Payer: Self-pay

## 2020-07-14 VITALS — BP 116/70

## 2020-07-14 DIAGNOSIS — Z90710 Acquired absence of both cervix and uterus: Secondary | ICD-10-CM

## 2020-07-14 NOTE — Progress Notes (Signed)
   Courtney Richard October 18, 1950 [id number]  REASON FOR APPOINTMENT Chief Complaint  Patient presents with  . Post Op check     HISTORY OF PRESENT ILLNESS Courtney Richard presents for routine 6 week post-operative follow up (actually now closer to 8 weeks post op) after having a vaginal hysterectomy with bilateral salpingo-oophorectomy and anterior colporrhaphy with cystoscopy on 05/19/2020 for symptomatic pelvic organ prolapse with cystocele and uterine prolapse.  The patient is doing well with no concerns.  She denies vaginal bleeding or discharge. She is tolerating a normal diet.  Bowel and bladder function are normal.  Pathology report was benign, indicating an endocervical polyp, normal ovaries and fallopian tubes.   OBJECTIVE  BP 116/70    PHYSICAL EXAM General:  Patient is no acute distress she is alert and oriented Abdomen:  Soft, nontender, nondistended.  No mass, rebound, nor guarding is appreciated.   Pelvic:  External genitalia appear to be within normal limits consistent with postmenopausal status.  Bartholin's and Skene glands and urethra appear to be within normal limits.  There are no lesions on the labia or vaginal tissues.  Vaginal epithelium is moist, pink, with expected loss of rugation.  There is no vaginal bleeding or abnormal discharge. Vaginal cuff appears intact without visible suture line or granulation tissue.  Bimanual exam indicates that the vaginal cuff is intact and without tenderness.  There is no adnexal mass, fullness, nor tenderness. Kennon Portela present for exam   ASSESSMENT / PLAN  Routine 6 week post operative check following TVH BSO anterior repair.   The patient is doing well and meeting all postoperative milestones.  I reviewed the benign pathology report from hysterectomy specimen.  She may gradually return to normal activities without restriction and resume normal gynecologic care at this point.    Theresia Majors MD 07/14/20

## 2020-11-06 ENCOUNTER — Other Ambulatory Visit: Payer: Self-pay | Admitting: Internal Medicine

## 2020-11-06 DIAGNOSIS — Z1231 Encounter for screening mammogram for malignant neoplasm of breast: Secondary | ICD-10-CM

## 2020-11-06 DIAGNOSIS — Z09 Encounter for follow-up examination after completed treatment for conditions other than malignant neoplasm: Secondary | ICD-10-CM

## 2021-01-21 ENCOUNTER — Other Ambulatory Visit: Payer: Self-pay | Admitting: Internal Medicine

## 2021-01-21 DIAGNOSIS — R921 Mammographic calcification found on diagnostic imaging of breast: Secondary | ICD-10-CM

## 2021-01-26 ENCOUNTER — Ambulatory Visit
Admission: RE | Admit: 2021-01-26 | Discharge: 2021-01-26 | Disposition: A | Discharge status: Home / Self Care | Payer: PRIVATE HEALTH INSURANCE | Source: Ambulatory Visit | Attending: Internal Medicine | Admitting: Internal Medicine

## 2021-01-26 ENCOUNTER — Other Ambulatory Visit: Payer: Self-pay

## 2021-01-26 ENCOUNTER — Other Ambulatory Visit: Payer: Self-pay | Admitting: Obstetrics and Gynecology

## 2021-01-26 DIAGNOSIS — R921 Mammographic calcification found on diagnostic imaging of breast: Secondary | ICD-10-CM

## 2022-02-08 ENCOUNTER — Other Ambulatory Visit: Payer: Self-pay | Admitting: Internal Medicine

## 2022-02-08 DIAGNOSIS — R921 Mammographic calcification found on diagnostic imaging of breast: Secondary | ICD-10-CM

## 2022-02-19 ENCOUNTER — Emergency Department (HOSPITAL_COMMUNITY): Payer: Medicare Other

## 2022-02-19 ENCOUNTER — Other Ambulatory Visit: Payer: Self-pay

## 2022-02-19 ENCOUNTER — Encounter: Payer: Self-pay | Admitting: Emergency Medicine

## 2022-02-19 ENCOUNTER — Encounter (HOSPITAL_COMMUNITY): Payer: Self-pay

## 2022-02-19 ENCOUNTER — Emergency Department (HOSPITAL_COMMUNITY)
Admission: EM | Admit: 2022-02-19 | Discharge: 2022-02-19 | Disposition: A | Discharge status: Home / Self Care | Payer: Medicare Other | Attending: Emergency Medicine | Admitting: Emergency Medicine

## 2022-02-19 ENCOUNTER — Ambulatory Visit (INDEPENDENT_AMBULATORY_CARE_PROVIDER_SITE_OTHER): Payer: Medicare Other

## 2022-02-19 ENCOUNTER — Ambulatory Visit
Admission: EM | Admit: 2022-02-19 | Discharge: 2022-02-19 | Disposition: A | Discharge status: Other Acute Inpatient Hospital | Payer: Medicare Other

## 2022-02-19 DIAGNOSIS — R109 Unspecified abdominal pain: Secondary | ICD-10-CM

## 2022-02-19 DIAGNOSIS — R0789 Other chest pain: Secondary | ICD-10-CM

## 2022-02-19 DIAGNOSIS — R1013 Epigastric pain: Secondary | ICD-10-CM

## 2022-02-19 DIAGNOSIS — I10 Essential (primary) hypertension: Secondary | ICD-10-CM

## 2022-02-19 DIAGNOSIS — Z8719 Personal history of other diseases of the digestive system: Secondary | ICD-10-CM

## 2022-02-19 DIAGNOSIS — R519 Headache, unspecified: Secondary | ICD-10-CM

## 2022-02-19 DIAGNOSIS — Z8711 Personal history of peptic ulcer disease: Secondary | ICD-10-CM

## 2022-02-19 DIAGNOSIS — R1010 Upper abdominal pain, unspecified: Secondary | ICD-10-CM

## 2022-02-19 HISTORY — DX: Peptic ulcer, site unspecified, unspecified as acute or chronic, without hemorrhage or perforation: K27.9

## 2022-02-19 HISTORY — DX: Essential (primary) hypertension: I10

## 2022-02-19 LAB — CBC
HCT: 40.3 % (ref 36.0–46.0)
Hemoglobin: 13.7 g/dL (ref 12.0–15.0)
MCH: 29.5 pg (ref 26.0–34.0)
MCHC: 34 g/dL (ref 30.0–36.0)
MCV: 86.9 fL (ref 80.0–100.0)
Platelets: 264 10*3/uL (ref 150–400)
RBC: 4.64 MIL/uL (ref 3.87–5.11)
RDW: 12.3 % (ref 11.5–15.5)
WBC: 10.1 10*3/uL (ref 4.0–10.5)
nRBC: 0 % (ref 0.0–0.2)

## 2022-02-19 LAB — COMPREHENSIVE METABOLIC PANEL
ALT: 21 U/L (ref 0–44)
AST: 16 U/L (ref 15–41)
Albumin: 4.4 g/dL (ref 3.5–5.0)
Alkaline Phosphatase: 136 U/L — ABNORMAL HIGH (ref 38–126)
Anion gap: 10 (ref 5–15)
BUN: 13 mg/dL (ref 8–23)
CO2: 23 mmol/L (ref 22–32)
Calcium: 9.1 mg/dL (ref 8.9–10.3)
Chloride: 107 mmol/L (ref 98–111)
Creatinine, Ser: 0.61 mg/dL (ref 0.44–1.00)
GFR, Estimated: >60 mL/min (ref >60)
Glucose, Bld: 118 mg/dL — ABNORMAL HIGH (ref 70–99)
Potassium: 3.2 mmol/L — ABNORMAL LOW (ref 3.5–5.1)
Sodium: 140 mmol/L (ref 135–145)
Total Bilirubin: 0.5 mg/dL (ref 0.3–1.2)
Total Protein: 7.1 g/dL (ref 6.5–8.1)

## 2022-02-19 LAB — TROPONIN I (HIGH SENSITIVITY): Troponin I (High Sensitivity): 4 ng/L (ref <18)

## 2022-02-19 LAB — LIPASE, BLOOD: Lipase: 37 U/L (ref 11–51)

## 2022-02-19 MED ORDER — ASPIRIN 81 MG PO CHEW
324.0000 mg | CHEWABLE_TABLET | Freq: Once | ORAL | Status: AC
  Administered 2022-02-19: 324 mg via ORAL

## 2022-02-19 MED ORDER — SUCRALFATE 1 G PO TABS: 1.0000 g | ORAL_TABLET | Freq: Four times a day (QID) | ORAL | 0 refills | Status: DC

## 2022-02-19 MED ORDER — PANTOPRAZOLE SODIUM 40 MG PO TBEC: 40.0000 mg | DELAYED_RELEASE_TABLET | Freq: Every day | ORAL | 0 refills | Status: AC

## 2022-02-19 MED ORDER — FAMOTIDINE 20 MG PO TABS
40.0000 mg | ORAL_TABLET | Freq: Once | ORAL | Status: AC
  Administered 2022-02-19: 40 mg via ORAL
  Filled 2022-02-19: qty 2

## 2022-02-19 MED ORDER — ALUM & MAG HYDROXIDE-SIMETH 200-200-20 MG/5ML PO SUSP
30.0000 mL | Freq: Once | ORAL | Status: AC
Start: 2022-02-19 — End: 2022-02-19
  Administered 2022-02-19: 30 mL via ORAL
  Filled 2022-02-19: qty 30

## 2022-02-19 MED ORDER — LIDOCAINE VISCOUS HCL 2 % MT SOLN
15.0000 mL | Freq: Once | OROMUCOSAL | Status: AC
  Administered 2022-02-19: 15 mL via ORAL
  Filled 2022-02-19: qty 15

## 2022-02-19 NOTE — Discharge Instructions (Signed)
Please had to the emergency room as I am concerned that you may be having a heart event.  You have an abnormal EKG that shows some ST wave changes which can mean that you are having a heart event.  Your chest x-ray was negative.  We have given you aspirin here in the clinic and I recommend that you go to the hospital now at [facility] for further testing to make sure your heart is okay. ?

## 2022-02-19 NOTE — ED Provider Notes (Addendum)
?[facility] EMERGENCY DEPARTMENT ?Provider Note ? ? ?CSN: [id number] ?Arrival date & time: 02/19/22  1105 ? ?  ? ?History ? ?Chief Complaint  ?Patient presents with  ? Abdominal Pain  ? ? ?Courtney Richard is a 71 y.o. female. ? ?71 year old female presents with epigastric abdominal pain times several weeks.  Got worse last night after she ate.  Food does make it worse.  Patient does have a history of hiatal hernia.  No associated cardiac symptomatology with this.  No emesis, fever, diarrhea.  Went to urgent care and was sent here for concern for possible ACS.  Family offered interpreter but declined ? ? ?  ? ?Home Medications ?Prior to Admission medications   ?Medication Sig Start Date End Date Taking? Authorizing Provider  ?amLODipine (NORVASC) 10 MG tablet Take 10 mg by mouth daily. 02/12/22   [provider]  ?   ? ?Allergies    ?Patient has no known allergies.   ? ?Review of Systems   ?Review of Systems  ?All other systems reviewed and are negative. ? ?Physical Exam ?Updated Vital Signs ?BP (!) 170/89 (BP Location: Left Arm)   Pulse 81   Temp 98.1 ?F (36.7 ?C) (Oral)   Resp 18   Ht 1.626 m (5\' 4" )   Wt 70.8 kg   SpO2 98%   BMI 26.78 kg/m?  ?Physical Exam ?Vitals and nursing note reviewed.  ?Constitutional:   ?   General: She is not in acute distress. ?   Appearance: Normal appearance. She is well-developed. She is not toxic-appearing.  ?HENT:  ?   Head: Normocephalic and atraumatic.  ?Eyes:  ?   General: Lids are normal.  ?   Conjunctiva/sclera: Conjunctivae normal.  ?   Pupils: Pupils are equal, round, and reactive to light.  ?Neck:  ?   Thyroid: No thyroid mass.  ?   Trachea: No tracheal deviation.  ?Cardiovascular:  ?   Rate and Rhythm: Normal rate and regular rhythm.  ?   Heart sounds: Normal heart sounds. No murmur heard. ?  No gallop.  ?Pulmonary:  ?   Effort: Pulmonary effort is normal. No respiratory distress.  ?   Breath sounds: Normal breath sounds. No stridor. No  decreased breath sounds, wheezing, rhonchi or rales.  ?Abdominal:  ?   General: There is no distension.  ?   Palpations: Abdomen is soft.  ?   Tenderness: There is abdominal tenderness in the epigastric area. There is no rebound.  ?   Comments: No right upper quadrant tenderness.  ?Musculoskeletal:     ?   General: No tenderness. Normal range of motion.  ?   Cervical back: Normal range of motion and neck supple.  ?Skin: ?   General: Skin is warm and dry.  ?   Findings: No abrasion or rash.  ?Neurological:  ?   Mental Status: She is alert and oriented to person, place, and time. Mental status is at baseline.  ?   GCS: GCS eye subscore is 4. GCS verbal subscore is 5. GCS motor subscore is 6.  ?   Cranial Nerves: No cranial nerve deficit.  ?   Sensory: No sensory deficit.  ?   Motor: Motor function is intact.  ?Psychiatric:     ?   Attention and Perception: Attention normal.     ?   Speech: Speech normal.     ?   Behavior: Behavior normal.  ? ? ?ED Results / Procedures / Treatments   ?  Labs ?(all labs ordered are listed, but only abnormal results are displayed) ?Labs Reviewed  ?COMPREHENSIVE METABOLIC PANEL - Abnormal; Notable for the following components:  ?    Result Value  ? Potassium 3.2 (*)   ? Glucose, Bld 118 (*)   ? Alkaline Phosphatase 136 (*)   ? All other components within normal limits  ?LIPASE, BLOOD  ?CBC  ?URINALYSIS, ROUTINE W REFLEX MICROSCOPIC  ?TROPONIN I (HIGH SENSITIVITY)  ? ? ?EKG ?EKG Interpretation ? ?Date/Time:  Friday Feb 19 2022 11:11:03 EDT ?Ventricular Rate:  84 ?PR Interval:  130 ?QRS Duration: 80 ?QT Interval:  370 ?QTC Calculation: 437 ?R Axis:   71 ?Text Interpretation: Normal sinus rhythm Nonspecific ST and T wave abnormality Abnormal ECG No previous ECGs available No old tracing to compare Confirmed by Lorre Nick ([id number]) on 02/19/2022 4:36:48 PM ? ?Radiology ?DG Chest 2 View ? ?Result Date: 02/19/2022 ?CLINICAL DATA:  atypical chest pain EXAM: CHEST - 2 VIEW COMPARISON:  None  Available. FINDINGS: No consolidation. No visible pleural effusions or pneumothorax. Biapical pleuroparenchymal scarring. Cardiomediastinal silhouette is within normal limits. IMPRESSION: No evidence of acute cardiopulmonary disease. Electronically Signed   By: Feliberto Harts M.D.   On: 02/19/2022 10:12   ? ?Procedures ?Procedures  ? ? ?Medications Ordered in ED ?Medications  ?alum & mag hydroxide-simeth (MAALOX/MYLANTA) 200-200-20 MG/5ML suspension 30 mL (has no administration in time range)  ?  And  ?lidocaine (XYLOCAINE) 2 % viscous mouth solution 15 mL (has no administration in time range)  ?famotidine (PEPCID) tablet 40 mg (has no administration in time range)  ? ? ?ED Course/ Medical Decision Making/ A&P ?  ?                        ?Medical Decision Making ?Amount and/or Complexity of Data Reviewed ?Labs: ordered. ?Radiology: ordered. ? ?Risk ?OTC drugs. ?Prescription drug management. ? ? ?Patient is EKG per my interpretation shows no signs of acute coronary ischemia.  Chest x-ray per my interpretation shows no evidence of acute process.  Patient presented with upper abdominal discomfort likely from hiatal hernia.  Consider other etiologies such as acute coronary syndrome.  Her EKG and troponin both do not support the diagnosis.  Was treated with oral medications and feels much better.  Concern for possible cervical issue with her gallbladder and abdominal ultrasound was performed and per my interpretation did not show any findings of that.  It did make mention of liver nodules which she is already aware of.  According to the husband, she already has follow-up with this.  Plan will be to place patient on medications for likely GERD.  Considered admission but do not feel that she needs it at this time.  She is stable for discharge.  I did use an interpreter for the latter portion of the patient's visit ? ? ? ? ? ? ? ?Final Clinical Impression(s) / ED Diagnoses ?Final diagnoses:  ?None  ? ? ?Rx / DC  Orders ?ED Discharge Orders   ? ? None  ? ?  ? ? ?  ?Lorre Nick, MD ?02/19/22 2010 ? ?  ?Lorre Nick, MD ?02/19/22 2012 ? ?

## 2022-02-19 NOTE — ED Provider Notes (Signed)
?[facility] ? ? ?MRN: [id number] DOB: [birth year] ? ?Subjective:  ? ?Courtney Richard is a 71 y.o. female presenting for 2 to 3-day history of acute onset recurrent upper abdominal pain that was severe last night rated 10 out of 10.  Today it is 5 out of 10.  She has also had nausea without vomiting and abdominal bloating today.  Her pain they changed nature and is now mid to lower midsternal chest pain.  Has felt left arm weakness and pain as well.  Has had a generalized headache all over for the past 2 weeks.  No shortness of breath, vomiting, diaphoresis, confusion, double vision, numbness or tingling.  No history of stroke, MI, heart disease.  She does have a history of high blood pressure and takes amlodipine for this.  She also takes pantoprazole once daily.  Reports that she had a complete work-up done with a gastroenterologist including an endoscopy and colonoscopy.  Reports that she thinks she had a hiatal hernia but nothing else was of concern.  She has a history of a peptic ulcer in her medical chart but reports that they confirmed that this was not the case through the endoscopy.  She has not been back to see them in a few years as she had completely normal testing and was recommended to follow-up in 10 years.  She is supposed to see a new primary care provider within the month.  No alcohol use, drug use, smoking.  No history of a cholecystectomy. Has a history of pre-diabetes.  ? ?No current facility-administered medications for this encounter. ? ?Current Outpatient Medications:  ?  amLODipine (NORVASC) 10 MG tablet, Take 10 mg by mouth daily., Disp: , Rfl:   ? ?No Known Allergies ? ?Past Medical History:  ?Diagnosis Date  ? Hypertension   ? Peptic ulcer   ?  ? ?History reviewed. No pertinent surgical history. ? ?Family History  ?Family history unknown: Yes  ? ? ?Social History  ? ?Tobacco Use  ? Smoking status: Never  ? Smokeless tobacco: Never  ?Substance Use Topics  ?  Alcohol use: Never  ? Drug use: Never  ? ? ?ROS ? ? ?Objective:  ? ?Vitals: ?BP (!) 167/91   Pulse 78   Temp 98 ?F (36.7 ?C) (Oral)   SpO2 97%  ? ?Physical Exam ?Constitutional:   ?   General: She is not in acute distress. ?   Appearance: Normal appearance. She is well-developed. She is not ill-appearing, toxic-appearing or diaphoretic.  ?HENT:  ?   Head: Normocephalic and atraumatic.  ?   Right Ear: External ear normal.  ?   Left Ear: External ear normal.  ?   Nose: Nose normal.  ?   Mouth/Throat:  ?   Mouth: Mucous membranes are moist.  ?   Pharynx: No oropharyngeal exudate or posterior oropharyngeal erythema.  ?Eyes:  ?   General: No scleral icterus.    ?   Right eye: No discharge.     ?   Left eye: No discharge.  ?   Extraocular Movements: Extraocular movements intact.  ?   Conjunctiva/sclera: Conjunctivae normal.  ?Cardiovascular:  ?   Rate and Rhythm: Normal rate and regular rhythm.  ?   Heart sounds: Normal heart sounds. No murmur heard. ?  No friction rub. No gallop.  ?Pulmonary:  ?   Effort: Pulmonary effort is normal. No respiratory distress.  ?   Breath sounds: No stridor. No wheezing, rhonchi or rales.  ?  Chest:  ?   Chest wall: No tenderness.  ?Abdominal:  ?   General: Bowel sounds are normal. There is distension (slight).  ?   Palpations: Abdomen is soft. There is no mass.  ?   Tenderness: There is generalized abdominal tenderness and tenderness in the epigastric area. There is no right CVA tenderness, left CVA tenderness, guarding or rebound.  ?Skin: ?   General: Skin is warm and dry.  ?Neurological:  ?   General: No focal deficit present.  ?   Mental Status: She is alert and oriented to person, place, and time.  ?   Cranial Nerves: No cranial nerve deficit.  ?   Motor: No weakness.  ?   Coordination: Coordination normal.  ?   Gait: Gait normal.  ?   Comments: No facial asymmetry, negative Romberg and pronator drift.  ?Psychiatric:     ?   Mood and Affect: Mood normal.     ?   Behavior: Behavior  normal.     ?   Thought Content: Thought content normal.     ?   Judgment: Judgment normal.  ? ? ?DG Chest 2 View ? ?Result Date: 02/19/2022 ?CLINICAL DATA:  atypical chest pain EXAM: CHEST - 2 VIEW COMPARISON:  None Available. FINDINGS: No consolidation. No visible pleural effusions or pneumothorax. Biapical pleuroparenchymal scarring. Cardiomediastinal silhouette is within normal limits. IMPRESSION: No evidence of acute cardiopulmonary disease. Electronically Signed   By: Feliberto Harts M.D.   On: 02/19/2022 10:12   ? ?ED ECG REPORT ? ? Date: 02/19/2022 ? EKG Time: 10:03 AM ? Rate: 74bpm ? Rhythm: normal sinus rhythm,  there are no previous tracings available for comparison ? Axis: normal ? Intervals:none ? ST&T Change: ST downsloping in II, III, aVF, V4-V5; no t-wave inversion   ? Narrative Interpretation: Sinus rhythm at 74bpm with non-specific t-wave changes as above. No previous ecg available for comparison.  ? ?Assessment and Plan :  ? ?PDMP not reviewed this encounter. ? ?1. Atypical chest pain   ?2. Upper abdominal pain   ?3. Abdominal pain, epigastric   ?4. History of peptic ulcer   ?5. History of hiatal hernia   ?6. Essential hypertension   ?7. Generalized headache   ? ? Case discussed with Dr. Leonides Grills. She has cardiac risk factors including HTN, age, female, history of pre-diabetes and an abnormal ecg.  EKG was reviewed as well and even though it is not true ST depression she does have downsloping and together with her symptoms that warrants further evaluation for rule out of a heart event through the emergency room.  I discussed this with patient and she prefers to go by personal vehicle.  I am in agreement that she is hemodynamically stable.  We have provided her with 324 mg of aspirin, she contracts for safety and will head to the emergency room now. ?  ?Wallis Bamberg, PA-C ?02/19/22 1023 ? ?

## 2022-02-19 NOTE — ED Triage Notes (Signed)
Pt c/o epigastric painx2 wks. Pt c/o nausea. Pt denies V/D. ?

## 2022-02-19 NOTE — ED Triage Notes (Addendum)
Pt here with epigastric pain that radiates to the back and has bloating and cramping along with headache x 2 weeks. Pt endorses some nausea. Pt has hx of peptic ulcers and takes medication for them, but does not remember what it is called.  ?

## 2022-02-19 NOTE — ED Notes (Signed)
Ultrasound in room

## 2022-03-18 ENCOUNTER — Ambulatory Visit: Payer: PRIVATE HEALTH INSURANCE | Admitting: Family Medicine

## 2022-04-02 ENCOUNTER — Other Ambulatory Visit: Payer: Self-pay | Admitting: Internal Medicine

## 2022-04-02 DIAGNOSIS — N63 Unspecified lump in unspecified breast: Secondary | ICD-10-CM

## 2022-04-02 DIAGNOSIS — R921 Mammographic calcification found on diagnostic imaging of breast: Secondary | ICD-10-CM

## 2022-04-07 ENCOUNTER — Ambulatory Visit
Admission: RE | Admit: 2022-04-07 | Discharge: 2022-04-07 | Disposition: A | Discharge status: Home / Self Care | Payer: Medicare Other | Source: Ambulatory Visit | Attending: Internal Medicine | Admitting: Internal Medicine

## 2022-04-07 DIAGNOSIS — R921 Mammographic calcification found on diagnostic imaging of breast: Secondary | ICD-10-CM

## 2022-04-07 DIAGNOSIS — N63 Unspecified lump in unspecified breast: Secondary | ICD-10-CM

## 2022-05-12 ENCOUNTER — Ambulatory Visit: Payer: Self-pay | Admitting: Family Medicine

## 2022-07-23 IMAGING — US US ABDOMEN LIMITED
1 series · 13 of 25 positions shown · non-contrast
Comparison: None Available.

CLINICAL DATA: Right upper quadrant pain

EXAM:
ULTRASOUND ABDOMEN LIMITED RIGHT UPPER QUADRANT

[Series 1: us abdomen limited · 13 of 62 slices shown]
[im 1/62]
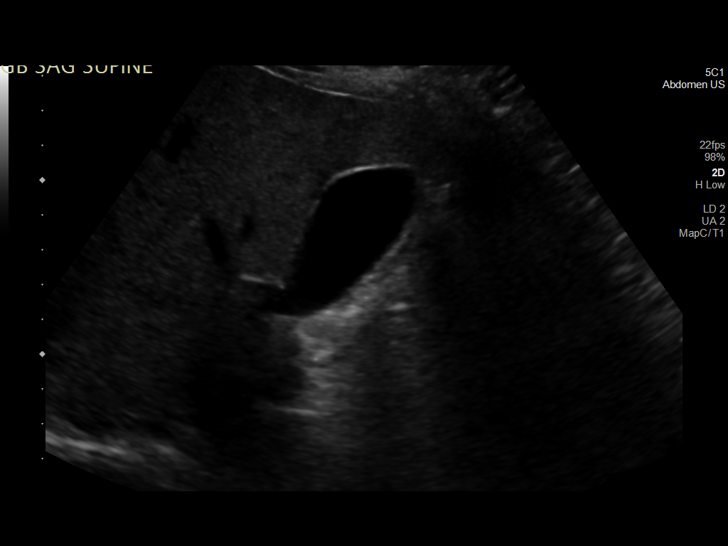
[im 6/62]
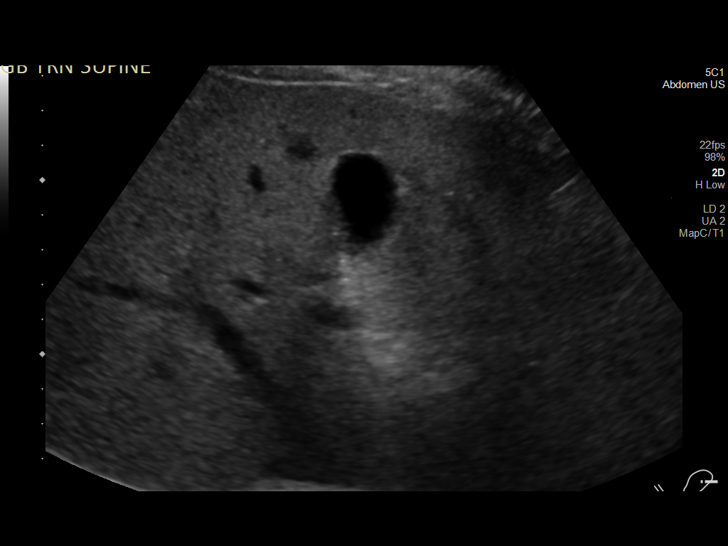
[im 11/62]
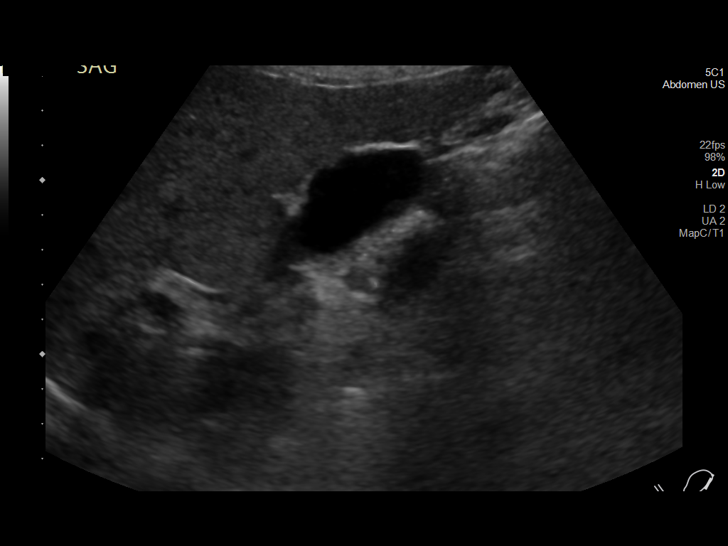
[im 16/62]
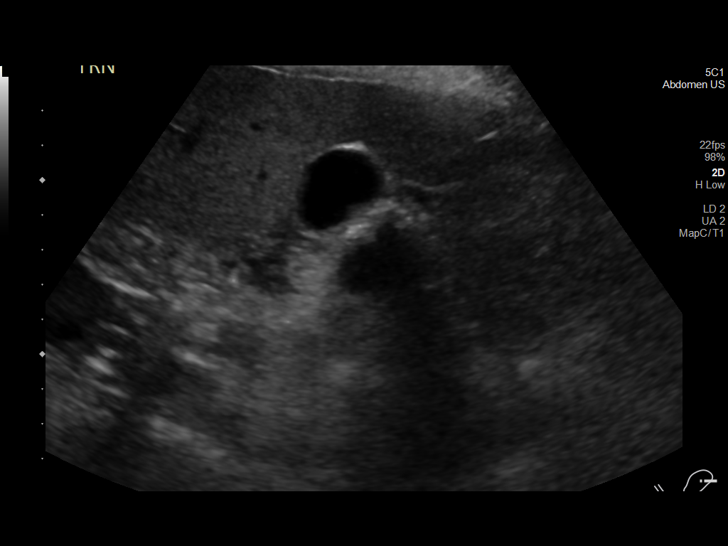
[im 21/62]
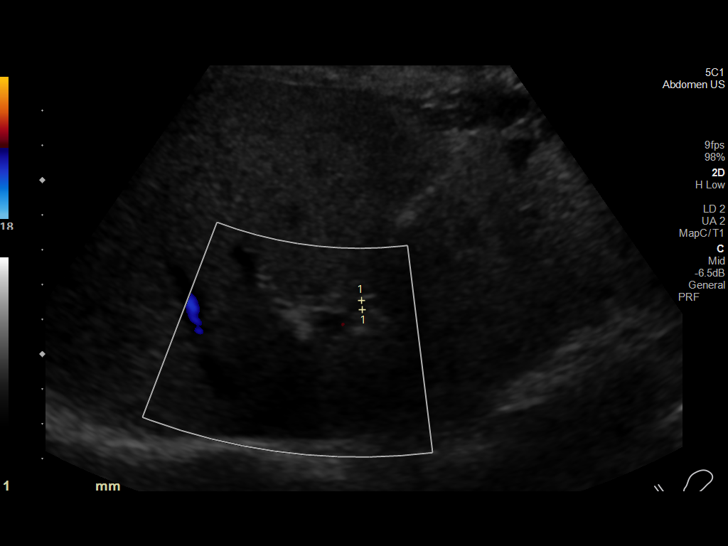
[im 26/62]
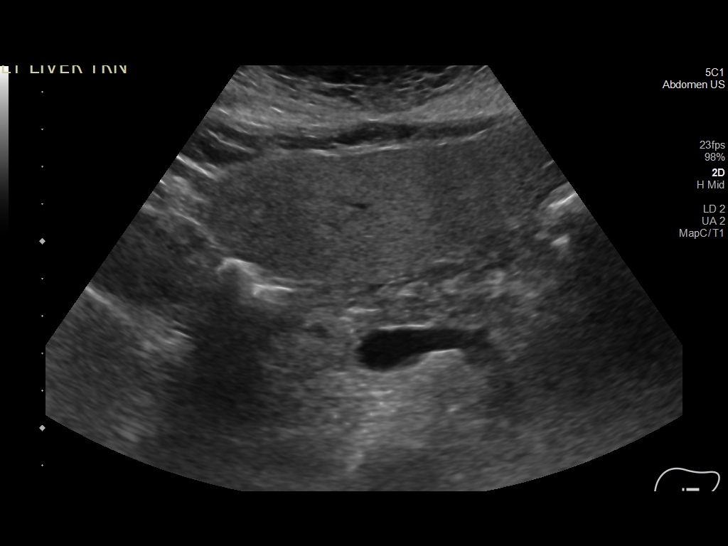
[im 31/62]
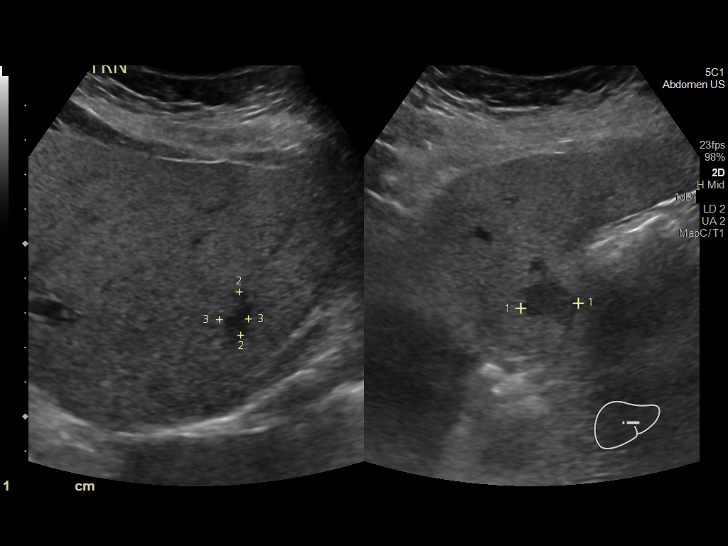
[im 36/62]
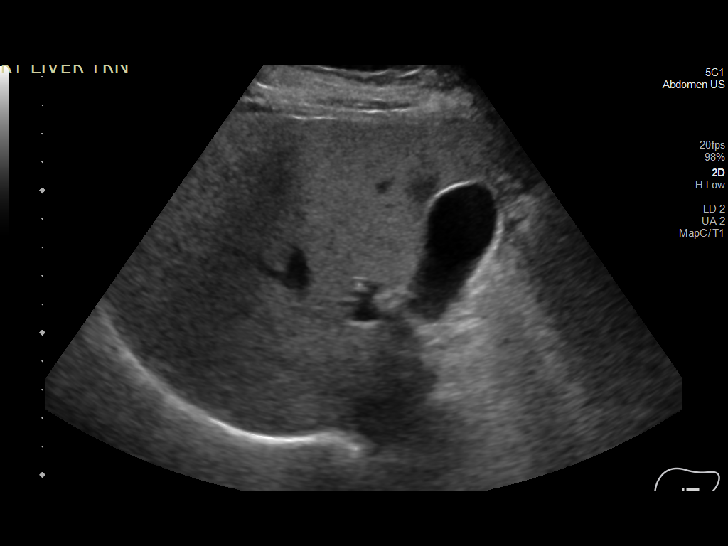
[im 41/62]
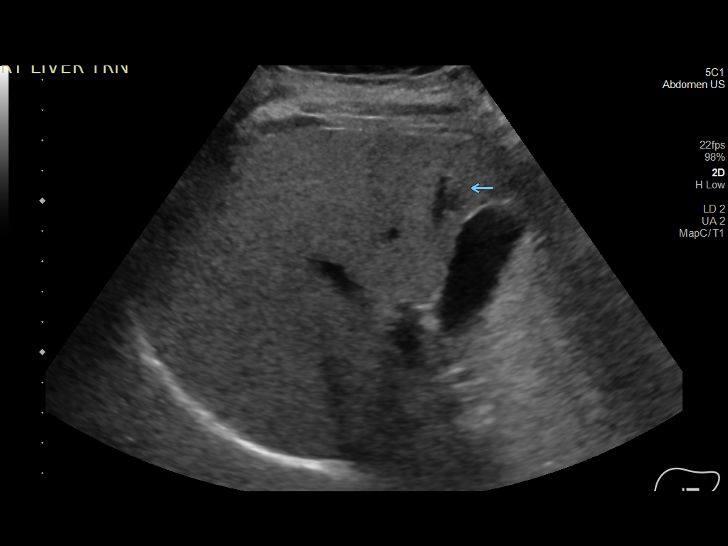
[im 46/62]
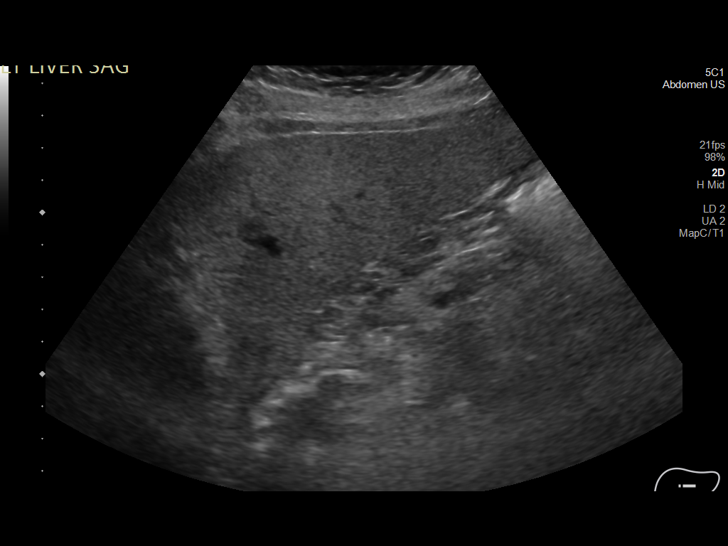
[im 51/62]
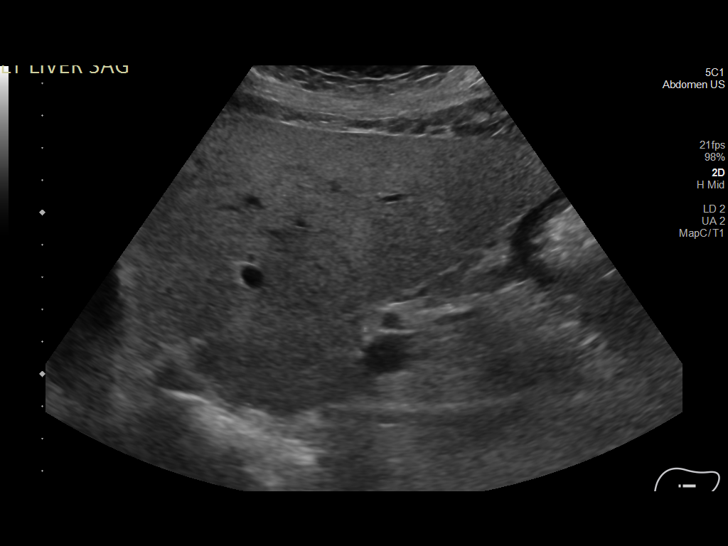
[im 56/62]
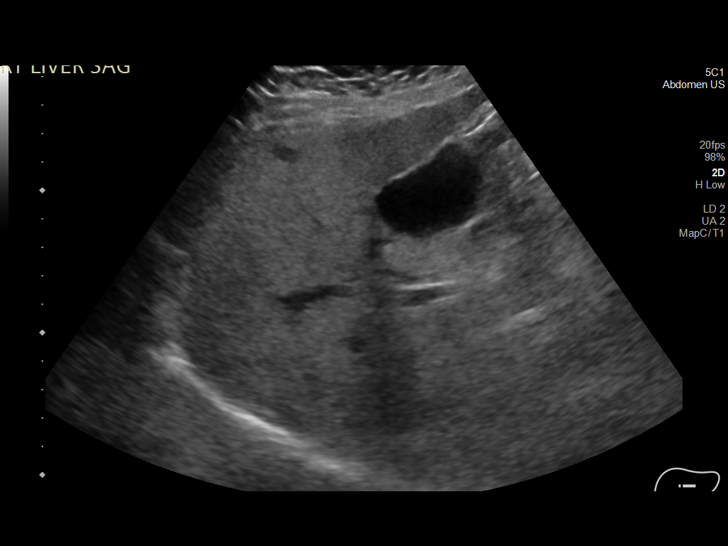
[im 62/62]
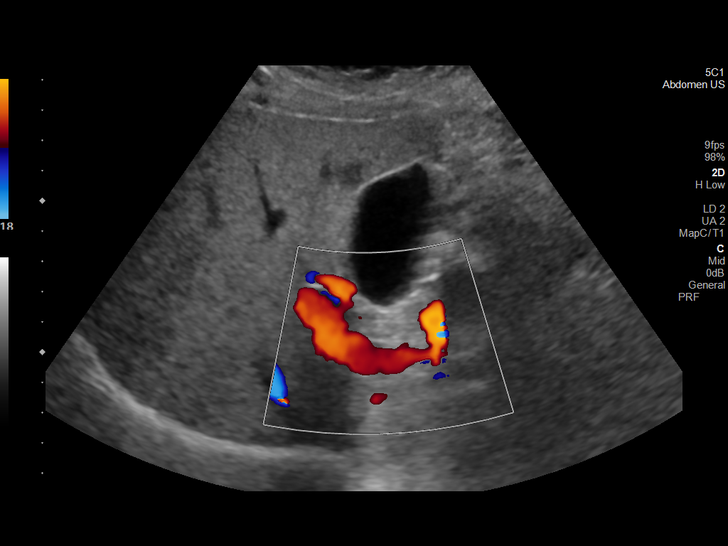

[13 of 25 positions shown; findings below may reference images not displayed]

FINDINGS: Gallbladder:

No gallstones or wall thickening visualized. No sonographic Murphy
sign noted by sonographer.

Common bile duct:

Diameter: 3 mm

Liver:

Heterogeneous increased liver echotexture consistent with hepatic
steatosis. There are numerous circumscribed hypoechoic areas
throughout the liver parenchyma. Within the ventral left lobe liver
circumscribed rounded hypoechoic lesion measures 1.2 x 0.8 x 0.9 cm.
Within the posterior left lobe liver ill-defined hypoechoic lesion
measures 1.7 x 1.3 x 0.8 cm. Within the central right lobe liver
circumscribed hypoechoic lesion measures 1.7 x 1.4 x 1.4 cm. These
lesions demonstrate minimal internal vascularity, and are
indeterminate by ultrasound. No intrahepatic duct dilation. Portal
vein is patent on color Doppler imaging with normal direction of
blood flow towards the liver.

Other: None.
IMPRESSION: 1. No evidence of cholelithiasis or cholecystitis.
2. Diffuse hepatic steatosis.
3. Three distinct rounded hypoechoic solid lesions within the liver,
indeterminate by ultrasound. Further evaluation with dedicated liver
CT or MRI could be considered on a nonemergent basis.

## 2022-08-25 ENCOUNTER — Ambulatory Visit (INDEPENDENT_AMBULATORY_CARE_PROVIDER_SITE_OTHER): Payer: Medicare HMO | Admitting: Obstetrics & Gynecology

## 2022-08-25 ENCOUNTER — Encounter: Payer: Self-pay | Admitting: Obstetrics & Gynecology

## 2022-08-25 VITALS — BP 116/74 | HR 83

## 2022-08-25 DIAGNOSIS — A58 Granuloma inguinale: Secondary | ICD-10-CM | POA: Diagnosis not present

## 2022-08-25 DIAGNOSIS — N939 Abnormal uterine and vaginal bleeding, unspecified: Secondary | ICD-10-CM | POA: Diagnosis not present

## 2022-08-25 DIAGNOSIS — N762 Acute vulvitis: Secondary | ICD-10-CM | POA: Diagnosis not present

## 2022-08-25 DIAGNOSIS — N898 Other specified noninflammatory disorders of vagina: Secondary | ICD-10-CM | POA: Diagnosis not present

## 2022-08-25 LAB — WET PREP FOR TRICH, YEAST, CLUE

## 2022-08-25 MED ORDER — NYSTATIN-TRIAMCINOLONE 100000-0.1 UNIT/GM-% EX OINT: 1.0000 | TOPICAL_OINTMENT | Freq: Every day | CUTANEOUS | 2 refills | Status: AC

## 2022-08-25 NOTE — Progress Notes (Signed)
    Courtney Richard [birth year] [id number]        71 y.o.  G3P3003   RP: Vaginal bleeding s/p Vaginal hysterectomy/BSO/Anterior Repair on 05/19/20  HPI: Vaginal bleeding s/p Vaginal hysterectomy/BSO/Anterior Repair on 05/19/20.  Very light vaginal spotting after passing a BM x about 1 month.  No rectal bleeding.  Urine normal.  Rarely sexually active.  C/O vulvar irritation.  No vaginal d/c.  No fever.   OB History  Gravida Para Term Preterm AB Living  3 3 3  0 0 3  SAB IAB Ectopic Multiple Live Births  0 0 0        # Outcome Date GA Lbr Len/2nd Weight Sex Delivery Anes PTL Lv  3 Term           2 Term           1 Term             Past medical history,surgical history, problem list, medications, allergies, family history and social history were all reviewed and documented in the EPIC chart.   Directed ROS with pertinent positives and negatives documented in the history of present illness/assessment and plan.  Exam:  Vitals:   08/25/22 1445  BP: 116/74  Pulse: 83  SpO2: 97%   General appearance:  Normal  Gynecologic exam: Vulva with mild/moderate erythema throughout.  Speculum:  Granuloma at the vaginal cuff.  Silver Nitrate applied.  Wet prep done.  Bimanual exam:  No pelvic mass, NT.  Wet prep:  Negative   Assessment/Plan:  71 y.o. G3P3L3   1. Vagina bleeding Vaginal bleeding s/p Vaginal hysterectomy/BSO/Anterior Repair on 05/19/20.  Very light vaginal spotting after passing a BM x about 1 month.  No rectal bleeding.  Urine normal.  Rarely sexually active.  C/O vulvar irritation.  No vaginal d/c.  No fever. Probably d/t a post vaginal hysterectomy granuloma at the vaginal cuff.  Burnt with Silver Nitrate.  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  3. Vaginal irritation Wet prep Negative. - WET PREP FOR TRICH, YEAST, CLUE  4. Acute vulvitis  Will treat with Mycolog ointment HS x 2 weeks, then as needed.  [facility]  Other orders - nystatin-triamcinolone ointment (MYCOLOG); Apply 1 Application topically at bedtime for 14 days.   07/19/20 MD, 2:54 PM 08/25/2022

## 2022-09-12 ENCOUNTER — Ambulatory Visit
Admission: EM | Admit: 2022-09-12 | Discharge: 2022-09-12 | Disposition: A | Discharge status: Home / Self Care | Payer: Medicare HMO | Attending: Emergency Medicine | Admitting: Emergency Medicine

## 2022-09-12 DIAGNOSIS — J209 Acute bronchitis, unspecified: Secondary | ICD-10-CM

## 2022-09-12 MED ORDER — ALBUTEROL SULFATE HFA 108 (90 BASE) MCG/ACT IN AERS: 2.0000 | INHALATION_SPRAY | Freq: Four times a day (QID) | RESPIRATORY_TRACT | 0 refills | Status: AC | PRN

## 2022-09-12 MED ORDER — PROMETHAZINE-DM 6.25-15 MG/5ML PO SYRP: 5.0000 mL | ORAL_SOLUTION | Freq: Every evening | ORAL | 0 refills | Status: AC | PRN

## 2022-09-12 MED ORDER — METHYLPREDNISOLONE 8 MG PO TABS: 8.0000 mg | ORAL_TABLET | Freq: Every day | ORAL | 0 refills | Status: AC

## 2022-09-12 MED ORDER — GUAIFENESIN 100 MG/5ML PO LIQD: 200.0000 mg | Freq: Four times a day (QID) | ORAL | 0 refills | Status: AC | PRN

## 2022-09-12 NOTE — ED Provider Notes (Signed)
[facility]    CSN: [id number] Arrival date & time: 09/12/22  1044    HISTORY   Chief Complaint  Patient presents with   Sore Throat   HPI Courtney Richard is a pleasant, 71 y.o. female who presents to urgent care today. Patient complains of hoarseness of voice, sore throat with cough, pain in upper chest with cough and dry cough for the past week.  Patient has significantly elevated blood pressure on arrival today, no fever, normal respirations, normal heart rate and oxygen saturation of 94% which is diminished from baseline.  Patient denies known sick contacts, body ache, chills, fever, nausea, vomiting, diarrhea, headache, rhinorrhea, nasal congestion.  The history is provided by the patient.   Past Medical History:  Diagnosis Date   Arthritis    hands   GERD (gastroesophageal reflux disease)    Hiatal hernia    History of 2019 novel coronavirus disease (COVID-19) 04/09/2019   hospitalized in epic   Hypertension    Hyperthyroidism    Peptic ulcer    Pneumonia 04/14/2019   Patient Active Problem List   Diagnosis Date Noted   Pneumonia due to COVID-19 virus 04/14/2019   Past Surgical History:  Procedure Laterality Date   ABDOMINAL HYSTERECTOMY     COLONOSCOPY  2005   CYSTOCELE REPAIR N/A 05/19/2020   Procedure: ANTERIOR REPAIR (CYSTOCELE);  Surgeon: Theresia Majors, MD;  Location: [facility];  Service: Gynecology;  Laterality: N/A;   CYSTOSCOPY N/A 05/19/2020   Procedure: CYSTOSCOPY;  Surgeon: Theresia Majors, MD;  Location: [facility];  Service: Gynecology;  Laterality: N/A;   OOPHORECTOMY Bilateral 05/19/2020   Procedure: VAGINAL BILATERAL SAPINGO-OOPHORECTOMY;  Surgeon: Theresia Majors, MD;  Location: [facility];  Service: Gynecology;  Laterality: Bilateral;   VAGINAL HYSTERECTOMY N/A 05/19/2020   Procedure: HYSTERECTOMY VAGINAL;  Surgeon: Theresia Majors, MD;  Location: [facility];   Service: Gynecology;  Laterality: N/A;  request 7:30am OR time.  Requests 2.5 hours   OB History     Gravida  3   Para  3   Term  3   Preterm  0   AB  0   Living  3      SAB  0   IAB  0   Ectopic  0   Multiple      Live Births             Home Medications    Prior to Admission medications   Medication Sig Start Date End Date Taking? Authorizing Provider  amLODipine-valsartan (EXFORGE) 10-320 MG tablet Take 1 tablet by mouth daily.    [provider]  metoprolol succinate (TOPROL-XL) 25 MG 24 hr tablet Take 25 mg by mouth daily.    [provider]  pantoprazole (PROTONIX) 40 MG tablet Take 1 tablet (40 mg total) by mouth daily. 02/19/22   Lorre Nick, MD    Family History Family History  Family history unknown: Yes   Social History Social History   Tobacco Use   Smoking status: Never   Smokeless tobacco: Never  Vaping Use   Vaping Use: Never used  Substance Use Topics   Alcohol use: Never   Drug use: Never   Allergies   Patient has no known allergies.  Review of Systems Review of Systems Pertinent findings revealed after performing a 14 point review of systems has been noted in the history of present illness.  Physical Exam Triage Vital Signs ED Triage  Vitals  Enc Vitals Group     BP 08/07/21 0827 (!) 147/82     Pulse Rate 08/07/21 0827 72     Resp 08/07/21 0827 18     Temp 08/07/21 0827 98.3 F (36.8 C)     Temp Source 08/07/21 0827 Oral     SpO2 08/07/21 0827 98 %     Weight --      Height --      Head Circumference --      Peak Flow --      Pain Score 08/07/21 0826 5     Pain Loc --      Pain Edu? --      Excl. in GC? --   No data found.  Updated Vital Signs BP (!) 162/85   Pulse 72   Temp 98.8 F (37.1 C) (Oral)   Resp 18   SpO2 94%   Physical Exam Vitals and nursing note reviewed.  Constitutional:      General: She is not in acute distress.    Appearance: Normal appearance. She is not  ill-appearing.  HENT:     Head: Normocephalic and atraumatic.     Salivary Glands: Right salivary gland is not diffusely enlarged or tender. Left salivary gland is not diffusely enlarged or tender.     Right Ear: Tympanic membrane, ear canal and external ear normal. No drainage. No middle ear effusion. There is no impacted cerumen. Tympanic membrane is not erythematous or bulging.     Left Ear: Tympanic membrane, ear canal and external ear normal. No drainage.  No middle ear effusion. There is no impacted cerumen. Tympanic membrane is not erythematous or bulging.     Nose: Nose normal. No nasal deformity, septal deviation, mucosal edema, congestion or rhinorrhea.     Right Turbinates: Not enlarged, swollen or pale.     Left Turbinates: Not enlarged, swollen or pale.     Right Sinus: No maxillary sinus tenderness or frontal sinus tenderness.     Left Sinus: No maxillary sinus tenderness or frontal sinus tenderness.     Mouth/Throat:     Lips: Pink. No lesions.     Mouth: Mucous membranes are moist. No oral lesions.     Pharynx: Oropharynx is clear. Uvula midline. No posterior oropharyngeal erythema or uvula swelling.     Tonsils: No tonsillar exudate. 0 on the right. 0 on the left.  Eyes:     General: Lids are normal.        Right eye: No discharge.        Left eye: No discharge.     Extraocular Movements: Extraocular movements intact.     Conjunctiva/sclera: Conjunctivae normal.     Right eye: Right conjunctiva is not injected.     Left eye: Left conjunctiva is not injected.  Neck:     Trachea: Trachea and phonation normal.  Cardiovascular:     Rate and Rhythm: Normal rate and regular rhythm.     Pulses: Normal pulses.     Heart sounds: Normal heart sounds. No murmur heard.    No friction rub. No gallop.  Pulmonary:     Effort: Pulmonary effort is normal. No tachypnea, bradypnea, accessory muscle usage, prolonged expiration, respiratory distress or retractions.     Breath sounds:  Normal breath sounds. No stridor, decreased air movement or transmitted upper airway sounds. No decreased breath sounds, wheezing, rhonchi or rales.     Comments: Coarse breath sounds throughout Chest:  Chest wall: No tenderness.  Musculoskeletal:        General: Normal range of motion.     Cervical back: Normal range of motion and neck supple. Normal range of motion.  Lymphadenopathy:     Cervical: No cervical adenopathy.  Skin:    General: Skin is warm and dry.     Findings: No erythema or rash.  Neurological:     General: No focal deficit present.     Mental Status: She is alert and oriented to person, place, and time.  Psychiatric:        Mood and Affect: Mood normal.        Behavior: Behavior normal.     Visual Acuity Right Eye Distance:   Left Eye Distance:   Bilateral Distance:    Right Eye Near:   Left Eye Near:    Bilateral Near:     UC Couse / Diagnostics / Procedures:     Radiology No results found.  Procedures Procedures (including critical care time) EKG  Pending results:  Labs Reviewed - No data to display  Medications Ordered in UC: Medications - No data to display  UC Diagnoses / Final Clinical Impressions(s)   I have reviewed the triage vital signs and the nursing notes.  Pertinent labs & imaging results that were available during my care of the patient were reviewed by me and considered in my medical decision making (see chart for details).    Final diagnoses:  Acute bronchitis, unspecified organism   Patient advised physical exam findings are concerning for bronchitis.  Patient provided with a prescription for albuterol and a 3-day course of methylprednisolone.  Patient provided with Promethazine DM for nighttime cough and advised can take guaifenesin in the daytime to promote expectoration.  Conservative care recommended, return precautions advised.  ED Prescriptions     Medication Sig Dispense Auth. Provider    promethazine-dextromethorphan (PROMETHAZINE-DM) 6.25-15 MG/5ML syrup Take 5 mLs by mouth at bedtime as needed for cough. 60 mL Theadora RamaMorgan, Birdella Sippel Scales, PA-C   albuterol (VENTOLIN HFA) 108 (90 Base) MCG/ACT inhaler Inhale 2 puffs into the lungs every 6 (six) hours as needed for wheezing or shortness of breath (Cough). 18 g Theadora RamaMorgan, Malaijah Houchen Scales, PA-C   methylPREDNISolone (MEDROL) 8 MG tablet Take 1 tablet (8 mg total) by mouth daily for 3 days. 3 tablet Theadora RamaMorgan, Madelina Sanda Scales, PA-C   guaiFENesin (ROBITUSSIN) 100 MG/5ML liquid Take 10 mLs (200 mg total) by mouth every 6 (six) hours as needed for cough or to loosen phlegm. 236 mL Theadora RamaMorgan, Ruven Corradi Scales, PA-C      PDMP not reviewed this encounter.  Pending results:  Labs Reviewed - No data to display  Discharge Instructions:   Discharge Instructions      Lea a continuacin para obtener ms informacin [employer] Corporationsobre los medicamentos, las dosis y las frecuencias que recomiendo para ayudar a Paramedicaliviar los sntomas de la bronquitis y sentirse mejor pronto:   ProAir, Ventolin, Proventil (albuterol): este medicamento inhalado contiene un broncodilatador beta agonista de accin corta. Este medicamento acta sobre el msculo liso que abre y Metallurgistcontrae las vas respiratorias relajando el msculo. El resultado de la relajacin del msculo liso es un mayor movimiento del aire y un mejor trabajo respiratorio. Este es un medicamento de accin corta que se puede usar cada 4 a 6 horas segn sea necesario para aumentar el trabajo respiratorio, dificultad para respirar, sibilancias y tos excesiva. He enviado una receta para este medicamento a su farmacia.  Medrol (metilprednisolona): este es un esteroide que calmar significativamente las vas respiratorias superiores e inferiores. Tome la cantidad diaria recomendada de tabletas con el desayuno a partir de maana por la maana hasta completar la receta.   Robitussin, Mucinex (guaifenesina): Este es un expectorante. Esto  ayuda a romper la congestin del pecho y Geologist, engineering el drenaje nasal espeso, haciendo que la flema y el drenaje sean ms lquidos y, por lo tanto, ms fciles de Pharmacologist. Recomiendo 200 mg tres veces al da segn sea necesario. He enviado una receta para este medicamento a su farmacia.   Prometazina DM: La prometazina es a Materials engineer nasal y Economist las nuseas que hace que la mayora de los pacientes se sientan bastante somnolientos. El DM es dextrometorfano, un supresor de la tos que se encuentra en muchos medicamentos para la tos de [place]. Tome 5 ml antes de acostarse para minimizar la tos, lo que le ayudar a Tourist information centre manager. He enviado una receta para este medicamento a su farmacia.   Realice un seguimiento dentro de los prximos 5 a [address], ya sea con su proveedor de atencin primaria o con atencin de urgencia si sus sntomas no desaparecen. Si no tiene un proveedor de [employer], lo ayudaremos a Dealer.        Gracias por visitar la atencin de urgencia hoy. Apreciamos la oportunidad de Systems analyst atencin.   Please read below to learn more about the medications, dosages and frequencies that I recommend to help alleviate your symptoms of bronchitis and to you feel better soon:   ProAir, Ventolin, Proventil (albuterol): This inhaled medication contains a short acting beta agonist bronchodilator.  This medication works on the smooth muscle that opens and constricts of your airways by relaxing the muscle.  The result of relaxation of the smooth muscle is increased air movement and improved work of breathing.  This is a short acting medication that can be used every 4-6 hours as needed for increased work of breathing, shortness of breath, wheezing and excessive coughing.  I have sent a prescription for this medication to your pharmacy.    Medrol (methylprednisolone): This is a steroid that will significantly calm your upper and lower airways,  please take the daily recommended quantity of tablets daily with your breakfast meal starting tomorrow morning until the prescription is complete.      Robitussin, Mucinex (guaifenesin): This is an expectorant.  This helps break up chest congestion and loosen up thick nasal drainage making phlegm and drainage more liquid and therefore easier to remove.  I recommend being 200 mg three times daily as needed.   I have sent a prescription for this medication to your pharmacy.   Promethazine DM: Promethazine is both a nasal decongestant and an antinausea medication that makes most patients feel fairly sleepy.  The DM is dextromethorphan, a cough suppressant found in many over-the-counter cough medications.  Please take 5 mL before bedtime to minimize your cough which will help you sleep better.  I have sent a prescription for this medication to your pharmacy.   Please follow-up within the next 5-7 days either with your primary care provider or urgent care if your symptoms do not resolve.  If you do not have a primary care provider, we will assist you in finding one.        Thank you for visiting urgent care today.  We appreciate the opportunity to participate in your care.  Disposition Upon Discharge:  Condition: stable for discharge home  Patient presented with an acute illness with associated systemic symptoms and significant discomfort requiring urgent management. In my opinion, this is a condition that a prudent lay person (someone who possesses an average knowledge of health and medicine) may potentially expect to result in complications if not addressed urgently such as respiratory distress, impairment of bodily function or dysfunction of bodily organs.   Routine symptom specific, illness specific and/or disease specific instructions were discussed with the patient and/or caregiver at length.   As such, the patient has been evaluated and assessed, work-up was performed and treatment was  provided in alignment with urgent care protocols and evidence based medicine.  Patient/parent/caregiver has been advised that the patient may require follow up for further testing and treatment if the symptoms continue in spite of treatment, as clinically indicated and appropriate.  Patient/parent/caregiver has been advised to return to the [facility] or PCP if no better; to PCP or the Emergency Department if new signs and symptoms develop, or if the current signs or symptoms continue to change or worsen for further workup, evaluation and treatment as clinically indicated and appropriate  The patient will follow up with their current PCP if and as advised. If the patient does not currently have a PCP we will assist them in obtaining one.   The patient may need specialty follow up if the symptoms continue, in spite of conservative treatment and management, for further workup, evaluation, consultation and treatment as clinically indicated and appropriate.   Patient/parent/caregiver verbalized understanding and agreement of plan as discussed.  All questions were addressed during visit.  Please see discharge instructions below for further details of plan.  This office note has been dictated using Teaching laboratory technician.  Unfortunately, this method of dictation can sometimes lead to typographical or grammatical errors.  I apologize for your inconvenience in advance if this occurs.  Please do not hesitate to reach out to me if clarification is needed.      Theadora Rama Scales, PA-C 09/12/22 1941

## 2022-09-12 NOTE — Discharge Instructions (Addendum)
Lea a continuacin para obtener ms informacin [employer], las dosis y las frecuencias que recomiendo para ayudar a Paramedic los sntomas de la bronquitis y sentirse mejor pronto:   ProAir, Ventolin, Proventil (albuterol): este medicamento inhalado contiene un broncodilatador beta agonista de accin corta. Este medicamento acta sobre el msculo liso que abre y Metallurgist las vas respiratorias relajando el msculo. El resultado de la relajacin del msculo liso es un mayor movimiento del aire y un mejor trabajo respiratorio. Este es un medicamento de accin corta que se puede usar cada 4 a 6 horas segn sea necesario para aumentar el trabajo respiratorio, dificultad para respirar, sibilancias y tos excesiva. He enviado una receta para este medicamento a su farmacia.    Medrol (metilprednisolona): este es un esteroide que calmar significativamente las vas respiratorias superiores e inferiores. Tome la cantidad diaria recomendada de tabletas con el desayuno a partir de maana por la maana hasta completar la receta.   Robitussin, Mucinex (guaifenesina): Este es un expectorante. Esto ayuda a romper la congestin del pecho y Geologist, engineering el drenaje nasal espeso, haciendo que la flema y el drenaje sean ms lquidos y, por lo tanto, ms fciles de Pharmacologist. Recomiendo 200 mg tres veces al da segn sea necesario. He enviado una receta para este medicamento a su farmacia.   Prometazina DM: La prometazina es a Materials engineer nasal y Economist las nuseas que hace que la mayora de los pacientes se sientan bastante somnolientos. El DM es dextrometorfano, un supresor de la tos que se encuentra en muchos medicamentos para la tos de [place]. Tome 5 ml antes de acostarse para minimizar la tos, lo que le ayudar a Tourist information centre manager. He enviado una receta para este medicamento a su farmacia.   Realice un seguimiento dentro de los prximos 5 a [address], ya sea con su proveedor de atencin  primaria o con atencin de urgencia si sus sntomas no desaparecen. Si no tiene un proveedor de [employer], lo ayudaremos a Dealer.        Gracias por visitar la atencin de urgencia hoy. Apreciamos la oportunidad de Systems analyst atencin.   Please read below to learn more about the medications, dosages and frequencies that I recommend to help alleviate your symptoms of bronchitis and to you feel better soon:   ProAir, Ventolin, Proventil (albuterol): This inhaled medication contains a short acting beta agonist bronchodilator.  This medication works on the smooth muscle that opens and constricts of your airways by relaxing the muscle.  The result of relaxation of the smooth muscle is increased air movement and improved work of breathing.  This is a short acting medication that can be used every 4-6 hours as needed for increased work of breathing, shortness of breath, wheezing and excessive coughing.  I have sent a prescription for this medication to your pharmacy.    Medrol (methylprednisolone): This is a steroid that will significantly calm your upper and lower airways, please take the daily recommended quantity of tablets daily with your breakfast meal starting tomorrow morning until the prescription is complete.      Robitussin, Mucinex (guaifenesin): This is an expectorant.  This helps break up chest congestion and loosen up thick nasal drainage making phlegm and drainage more liquid and therefore easier to remove.  I recommend being 200 mg three times daily as needed.   I have sent a prescription for this medication to your pharmacy.   Promethazine DM: Promethazine is both  a nasal decongestant and an antinausea medication that makes most patients feel fairly sleepy.  The DM is dextromethorphan, a cough suppressant found in many over-the-counter cough medications.  Please take 5 mL before bedtime to minimize your cough which will help you sleep better.  I have sent a prescription  for this medication to your pharmacy.   Please follow-up within the next 5-7 days either with your primary care provider or urgent care if your symptoms do not resolve.  If you do not have a primary care provider, we will assist you in finding one.        Thank you for visiting urgent care today.  We appreciate the opportunity to participate in your care.

## 2022-09-12 NOTE — ED Triage Notes (Signed)
Pt reports sore throat and cough x 1 week. OTC meds gives no relief.

## 2022-10-15 ENCOUNTER — Ambulatory Visit: Payer: Medicare Other | Admitting: Obstetrics & Gynecology

## 2022-12-15 ENCOUNTER — Ambulatory Visit: Payer: Medicare Other | Admitting: Obstetrics & Gynecology

## 2023-03-03 ENCOUNTER — Other Ambulatory Visit: Payer: Self-pay | Admitting: Internal Medicine

## 2023-03-03 DIAGNOSIS — Z0000 Encounter for general adult medical examination without abnormal findings: Secondary | ICD-10-CM

## 2023-04-11 ENCOUNTER — Ambulatory Visit
Admission: RE | Admit: 2023-04-11 | Discharge: 2023-04-11 | Disposition: A | Discharge status: Home / Self Care | Payer: Medicare HMO | Source: Ambulatory Visit | Attending: Internal Medicine | Admitting: Internal Medicine

## 2023-04-11 DIAGNOSIS — Z0000 Encounter for general adult medical examination without abnormal findings: Secondary | ICD-10-CM

## 2023-07-01 ENCOUNTER — Other Ambulatory Visit: Payer: Self-pay | Admitting: Internal Medicine

## 2023-07-01 DIAGNOSIS — R1013 Epigastric pain: Secondary | ICD-10-CM

## 2023-07-08 ENCOUNTER — Ambulatory Visit
Admission: RE | Admit: 2023-07-08 | Discharge: 2023-07-08 | Disposition: A | Discharge status: Home / Self Care | Payer: Medicare HMO | Source: Ambulatory Visit | Attending: Internal Medicine | Admitting: Internal Medicine

## 2023-07-08 DIAGNOSIS — R1013 Epigastric pain: Secondary | ICD-10-CM

## 2023-07-21 ENCOUNTER — Emergency Department (HOSPITAL_BASED_OUTPATIENT_CLINIC_OR_DEPARTMENT_OTHER)
Admission: EM | Admit: 2023-07-21 | Discharge: 2023-07-22 | Disposition: A | Discharge status: Home / Self Care | Payer: Medicare HMO | Attending: Emergency Medicine | Admitting: Emergency Medicine

## 2023-07-21 ENCOUNTER — Encounter (HOSPITAL_BASED_OUTPATIENT_CLINIC_OR_DEPARTMENT_OTHER): Payer: Self-pay | Admitting: Emergency Medicine

## 2023-07-21 ENCOUNTER — Emergency Department (HOSPITAL_BASED_OUTPATIENT_CLINIC_OR_DEPARTMENT_OTHER): Payer: Medicare HMO

## 2023-07-21 DIAGNOSIS — I10 Essential (primary) hypertension: Secondary | ICD-10-CM | POA: Insufficient documentation

## 2023-07-21 DIAGNOSIS — Z79899 Other long term (current) drug therapy: Secondary | ICD-10-CM | POA: Diagnosis not present

## 2023-07-21 DIAGNOSIS — R0789 Other chest pain: Secondary | ICD-10-CM | POA: Insufficient documentation

## 2023-07-21 DIAGNOSIS — R079 Chest pain, unspecified: Secondary | ICD-10-CM

## 2023-07-21 DIAGNOSIS — R059 Cough, unspecified: Secondary | ICD-10-CM | POA: Diagnosis not present

## 2023-07-21 DIAGNOSIS — R600 Localized edema: Secondary | ICD-10-CM | POA: Diagnosis not present

## 2023-07-21 DIAGNOSIS — M7989 Other specified soft tissue disorders: Secondary | ICD-10-CM | POA: Insufficient documentation

## 2023-07-21 LAB — CBC WITH DIFFERENTIAL/PLATELET
Abs Immature Granulocytes: 0.03 10*3/uL (ref 0.00–0.07)
Basophils Absolute: 0.1 10*3/uL (ref 0.0–0.1)
Basophils Relative: 1 %
Eosinophils Absolute: 0.3 10*3/uL (ref 0.0–0.5)
Eosinophils Relative: 3 %
HCT: 38.1 % (ref 36.0–46.0)
Hemoglobin: 12.7 g/dL (ref 12.0–15.0)
Immature Granulocytes: 0 %
Lymphocytes Relative: 29 %
Lymphs Abs: 2.4 10*3/uL (ref 0.7–4.0)
MCH: 29 pg (ref 26.0–34.0)
MCHC: 33.3 g/dL (ref 30.0–36.0)
MCV: 87 fL (ref 80.0–100.0)
Monocytes Absolute: 0.6 10*3/uL (ref 0.1–1.0)
Monocytes Relative: 8 %
Neutro Abs: 5 10*3/uL (ref 1.7–7.7)
Neutrophils Relative %: 59 %
Platelets: 263 10*3/uL (ref 150–400)
RBC: 4.38 MIL/uL (ref 3.87–5.11)
RDW: 12.9 % (ref 11.5–15.5)
WBC: 8.3 10*3/uL (ref 4.0–10.5)
nRBC: 0 % (ref 0.0–0.2)

## 2023-07-21 LAB — BASIC METABOLIC PANEL
Anion gap: 7 (ref 5–15)
BUN: 13 mg/dL (ref 8–23)
CO2: 27 mmol/L (ref 22–32)
Calcium: 9.1 mg/dL (ref 8.9–10.3)
Chloride: 105 mmol/L (ref 98–111)
Creatinine, Ser: 0.67 mg/dL (ref 0.44–1.00)
GFR, Estimated: >60 mL/min (ref >60)
Glucose, Bld: 138 mg/dL — ABNORMAL HIGH (ref 70–99)
Potassium: 3.7 mmol/L (ref 3.5–5.1)
Sodium: 139 mmol/L (ref 135–145)

## 2023-07-21 LAB — BRAIN NATRIURETIC PEPTIDE: B Natriuretic Peptide: 22.7 pg/mL (ref 0.0–100.0)

## 2023-07-21 LAB — TROPONIN I (HIGH SENSITIVITY): Troponin I (High Sensitivity): 3 ng/L (ref <18)

## 2023-07-21 MED ORDER — IOHEXOL 350 MG/ML SOLN
100.0000 mL | Freq: Once | INTRAVENOUS | Status: AC | PRN
  Administered 2023-07-21: 80 mL via INTRAVENOUS

## 2023-07-21 NOTE — Discharge Instructions (Signed)
You were seen for your leg swelling and chest pain in the emergency department.   At home, please take Tylenol and ibuprofen for pain.  Use compression stockings for your leg.  Check your MyChart online for the results of any tests that had not resulted by the time you left the emergency department.   Follow-up with your primary doctor in 2-3 days regarding your visit.    Return immediately to the emergency department if you experience any of the following: Difficulty breathing, or any other concerning symptoms.    Thank you for visiting our Emergency Department. It was a pleasure taking care of you today.

## 2023-07-21 NOTE — ED Notes (Signed)
Patient ambulated to restroom with steady gait.

## 2023-07-21 NOTE — ED Triage Notes (Signed)
Left leg swelling noticed 2 days ago, painful. Denies injury

## 2023-07-21 NOTE — ED Provider Notes (Signed)
[place] EMERGENCY DEPARTMENT AT [facility] Provider Note   CSN: [id number] Arrival date & time: 07/21/23  1949     History  No chief complaint on file.   [facility] Courtney Richard is a 72 y.o. female.  History obtained via Spanish interpreter  72 year old female with a history of hypertension who presents to the emergency department with left lower extremity pain.  For the past 3 days has had left lower extremity swelling.  No trauma or injuries.  Says it is mostly painful and swollen in her calf.  Over the past 2 weeks has also been having sharp substernal nonpleuritic or exertional chest discomfort.  Says that she has had a nonproductive cough.  Denies any shortness of breath.  No history of DVT or PE, hormone use, cancer, or recent surgery.  No recent travel.       Home Medications Prior to Admission medications   Medication Sig Start Date End Date Taking? Authorizing Provider  albuterol (VENTOLIN HFA) 108 (90 Base) MCG/ACT inhaler Inhale 2 puffs into the lungs every 6 (six) hours as needed for wheezing or shortness of breath (Cough). 09/12/22   Theadora Rama Scales, PA-C  amLODipine-valsartan (EXFORGE) 10-320 MG tablet Take 1 tablet by mouth daily.    [provider]  guaiFENesin (ROBITUSSIN) 100 MG/5ML liquid Take 10 mLs (200 mg total) by mouth every 6 (six) hours as needed for cough or to loosen phlegm. 09/12/22   Theadora Rama Scales, PA-C  metoprolol succinate (TOPROL-XL) 25 MG 24 hr tablet Take 25 mg by mouth daily.    [provider]  pantoprazole (PROTONIX) 40 MG tablet Take 1 tablet (40 mg total) by mouth daily. 02/19/22   Lorre Nick, MD  promethazine-dextromethorphan (PROMETHAZINE-DM) 6.25-15 MG/5ML syrup Take 5 mLs by mouth at bedtime as needed for cough. 09/12/22   Theadora Rama Scales, PA-C      Allergies    Patient has no known allergies.    Review of Systems   Review of Systems  Physical Exam Updated Vital Signs BP  (!) 159/69   Pulse (!) 58   Temp 98.7 F (37.1 C)   Resp 18   Ht 5\' 3"  (1.6 m)   Wt 70.3 kg   SpO2 97%   BMI 27.46 kg/m  Physical Exam Vitals and nursing note reviewed.  Constitutional:      General: She is not in acute distress.    Appearance: She is well-developed.  HENT:     Head: Normocephalic and atraumatic.     Right Ear: External ear normal.     Left Ear: External ear normal.     Nose: Nose normal.  Eyes:     Extraocular Movements: Extraocular movements intact.     Conjunctiva/sclera: Conjunctivae normal.     Pupils: Pupils are equal, round, and reactive to light.  Cardiovascular:     Rate and Rhythm: Normal rate and regular rhythm.     Heart sounds: No murmur heard. Pulmonary:     Effort: Pulmonary effort is normal. No respiratory distress.     Breath sounds: Normal breath sounds.  Musculoskeletal:     Cervical back: Normal range of motion and neck supple.     Right lower leg: No edema.     Left lower leg: Edema (1+) present.  Skin:    General: Skin is warm and dry.  Neurological:     Mental Status: She is alert and oriented to person, place, and time. Mental status is at baseline.  Psychiatric:        Mood and Affect: Mood normal.     ED Results / Procedures / Treatments   Labs (all labs ordered are listed, but only abnormal results are displayed) Labs Reviewed  BASIC METABOLIC PANEL - Abnormal; Notable for the following components:      Result Value   Glucose, Bld 138 (*)    All other components within normal limits  CBC WITH DIFFERENTIAL/PLATELET  BRAIN NATRIURETIC PEPTIDE  TROPONIN I (HIGH SENSITIVITY)  TROPONIN I (HIGH SENSITIVITY)    EKG None  Radiology CT Angio Chest PE W and/or Wo Contrast  Result Date: 07/21/2023 CLINICAL DATA:  Left leg swelling, chest pain EXAM: CT ANGIOGRAPHY CHEST WITH CONTRAST TECHNIQUE: Multidetector CT imaging of the chest was performed using the standard protocol during bolus administration of intravenous  contrast. Multiplanar CT image reconstructions and MIPs were obtained to evaluate the vascular anatomy. RADIATION DOSE REDUCTION: This exam was performed according to the departmental dose-optimization program which includes automated exposure control, adjustment of the mA and/or kV according to patient size and/or use of iterative reconstruction technique. CONTRAST:  80mL OMNIPAQUE IOHEXOL 350 MG/ML SOLN COMPARISON:  None Available. FINDINGS: Cardiovascular: Adequate opacification of the pulmonary arterial tree. No intraluminal filling defect identified to suggest acute pulmonary embolism. Central pulmonary arteries are of normal caliber. No significant coronary artery calcification. Cardiac size within normal limits. No pericardial effusion. Mild atherosclerotic calcification within the thoracic aorta. No aortic aneurysm. Mediastinum/Nodes: No enlarged mediastinal, hilar, or axillary lymph nodes. Thyroid gland, trachea, and esophagus demonstrate no significant findings. Small hiatal hernia. Lungs/Pleura: Very mild scattered bilateral upper lobe ground-glass pulmonary infiltrates are present, possibly infectious in the acute setting. Bibasilar atelectasis noted. No focal consolidation. No pneumothorax or pleural effusion. No central obstructing lesion. Upper Abdomen: No acute abnormality. Musculoskeletal: No chest wall abnormality. No acute or significant osseous findings. Review of the MIP images confirms the above findings. IMPRESSION: 1. No pulmonary embolism. 2. Very mild scattered bilateral upper lobe ground-glass pulmonary infiltrates, possibly infectious in the acute setting. 3. Small hiatal hernia. Aortic Atherosclerosis (ICD10-I70.0). Electronically Signed   By: Helyn Numbers M.D.   On: 07/21/2023 23:59   US Venous Img Lower Unilateral Left  Result Date: 07/21/2023 CLINICAL DATA:  Left lower extremity edema and pain EXAM: Left LOWER EXTREMITY VENOUS DOPPLER ULTRASOUND TECHNIQUE: Gray-scale sonography  with compression, as well as color and duplex ultrasound, were performed to evaluate the deep venous system(s) from the level of the common femoral vein through the popliteal and proximal calf veins. COMPARISON:  None Available. FINDINGS: VENOUS Normal compressibility of the common femoral, superficial femoral, and popliteal veins, as well as the visualized calf veins. Visualized portions of profunda femoral vein and great saphenous vein unremarkable. No filling defects to suggest DVT on grayscale or color Doppler imaging. Doppler waveforms show normal direction of venous flow, normal respiratory plasticity and response to augmentation. Limited views of the contralateral common femoral vein are unremarkable. OTHER None. Limitations: none IMPRESSION: Negative. Electronically Signed   By: Minerva Fester M.D.   On: 07/21/2023 23:08    Procedures Procedures    Medications Ordered in ED Medications  iohexol (OMNIPAQUE) 350 MG/ML injection 100 mL (80 mLs Intravenous Contrast Given 07/21/23 2250)    ED Course/ Medical Decision Making/ A&P Clinical Course as of 07/22/23 2235  Thu Jul 21, 2023  2325 Signed out to Dr Judd Lien [RP]    Clinical Course User Index [RP] Rondel Baton, MD  Medical Decision Making Amount and/or Complexity of Data Reviewed Labs: ordered. Radiology: ordered.  Risk Prescription drug management.   [facility] Courtney Richard is a 72 y.o. female with comorbidities that complicate the patient evaluation including hypertension who presents emergency department with left lower extremity pain, cough, and chest pain  Initial Ddx:  PE/DVT, septic joint, arthritis, Baker's cyst, MI  MDM/Course:  Patient presents emergency department with swelling of her left lower extremity.  Also is having some chest discomfort and cough.  On exam does have swelling of her left calf and lower extremity.  Does have a small knee effusion as well but has full  range of motion so low concern for septic joint.  Patient had a ultrasound obtained of her leg and a CTA of her chest that was ordered.  We are awaiting the results at this time.  EKG and initial troponin without signs of active ischemia.  Upon re-evaluation the patient remained stable.  Signed out to the oncoming physician (Dr. Judd Lien) awaiting the results of her imaging.  This patient presents to the ED for concern of complaints listed in HPI, this involves an extensive number of treatment options, and is a complaint that carries with it a high risk of complications and morbidity. Disposition including potential need for admission considered.   Dispo: DC Home. Return precautions discussed including, but not limited to, those listed in the AVS. Allowed pt time to ask questions which were answered fully prior to dc.  Additional history obtained from spouse Records reviewed Outpatient Clinic Notes The following labs were independently interpreted: Serial Troponins and show no acute abnormality I personally reviewed and interpreted cardiac monitoring: normal sinus rhythm  I personally reviewed and interpreted the pt's EKG: see above for interpretation  I have reviewed the patients home medications and made adjustments as needed Social Determinants of health:  Non-English speaking  Portions of this note were generated with Scientist, clinical (histocompatibility and immunogenetics). Dictation errors may occur despite best attempts at proofreading.           Final Clinical Impression(s) / ED Diagnoses Final diagnoses:  Leg swelling  Chest pain, unspecified type    Rx / DC Orders ED Discharge Orders     None         Rondel Baton, MD 07/22/23 2235

## 2023-07-22 LAB — TROPONIN I (HIGH SENSITIVITY): Troponin I (High Sensitivity): 3 ng/L (ref <18)

## 2023-07-22 NOTE — ED Notes (Signed)
Patient resting quietly in stretcher, respirations even, unlabored, no acute distress noted. Denies needs at this time.  

## 2023-07-22 NOTE — ED Notes (Signed)
Reviewed AVS with patient, patient expressed understanding of directions, denies further questions at this time. 

## 2023-07-22 NOTE — ED Provider Notes (Signed)
  Physical Exam  BP (!) 159/69   Pulse (!) 58   Temp 98.7 F (37.1 C)   Resp 18   Ht 5\' 3"  (1.6 m)   Wt 70.3 kg   SpO2 97%   BMI 27.46 kg/m   Physical Exam Vitals and nursing note reviewed.  Constitutional:      Appearance: Normal appearance.  HENT:     Head: Normocephalic.  Pulmonary:     Effort: Pulmonary effort is normal.  Skin:    General: Skin is warm and dry.  Neurological:     Mental Status: She is alert and oriented to person, place, and time.     Procedures  Procedures  ED Course / MDM   Clinical Course as of 07/22/23 4098  Thu Jul 21, 2023  2325 Signed out to Dr Judd Lien [RP]    Clinical Course User Index [RP] Rondel Baton, MD   Medical Decision Making Amount and/or Complexity of Data Reviewed Labs: ordered. Radiology: ordered.  Risk Prescription drug management.   Care assumed from Dr. Eloise Harman at shift change.  Patient awaiting results of his CTA of the chest and ultrasound of the leg.  Both of these studies have returned and are negative for DVT.  There is the mention of very slight groundglass type infiltrates in the upper lobes, however this does not fit the clinical picture.  I doubt pneumonia.  At this point, I feel as though patient can safely be discharged with rest, ibuprofen, and follow-up as needed.       Geoffery Lyons, MD 07/22/23 [phone]

## 2023-09-05 ENCOUNTER — Other Ambulatory Visit: Payer: Self-pay | Admitting: Internal Medicine

## 2023-09-05 DIAGNOSIS — R160 Hepatomegaly, not elsewhere classified: Secondary | ICD-10-CM

## 2023-10-25 ENCOUNTER — Ambulatory Visit
Admission: RE | Admit: 2023-10-25 | Discharge: 2023-10-25 | Disposition: A | Discharge status: Home / Self Care | Payer: Medicare HMO | Source: Ambulatory Visit | Attending: Internal Medicine | Admitting: Internal Medicine

## 2023-10-25 DIAGNOSIS — R160 Hepatomegaly, not elsewhere classified: Secondary | ICD-10-CM

## 2023-10-25 MED ORDER — GADOPICLENOL 0.5 MMOL/ML IV SOLN
7.0000 mL | Freq: Once | INTRAVENOUS | Status: AC | PRN
  Administered 2023-10-25: 7 mL via INTRAVENOUS

## 2024-04-05 ENCOUNTER — Other Ambulatory Visit: Payer: Self-pay | Admitting: Internal Medicine

## 2024-04-05 DIAGNOSIS — Z1231 Encounter for screening mammogram for malignant neoplasm of breast: Secondary | ICD-10-CM

## 2024-04-11 ENCOUNTER — Ambulatory Visit
Admission: RE | Admit: 2024-04-11 | Discharge: 2024-04-11 | Disposition: A | Discharge status: Home / Self Care | Source: Ambulatory Visit | Attending: Internal Medicine | Admitting: Internal Medicine

## 2024-04-11 DIAGNOSIS — Z1231 Encounter for screening mammogram for malignant neoplasm of breast: Secondary | ICD-10-CM

## 2024-05-14 ENCOUNTER — Other Ambulatory Visit: Payer: Self-pay | Admitting: Internal Medicine

## 2024-05-14 DIAGNOSIS — K769 Liver disease, unspecified: Secondary | ICD-10-CM

## 2024-10-30 ENCOUNTER — Other Ambulatory Visit: Payer: Self-pay

## 2024-10-30 ENCOUNTER — Emergency Department (HOSPITAL_COMMUNITY)
Admission: EM | Admit: 2024-10-30 | Discharge: 2024-10-30 | Disposition: A | Discharge status: Home / Self Care | Attending: Emergency Medicine | Admitting: Emergency Medicine

## 2024-10-30 ENCOUNTER — Encounter (HOSPITAL_COMMUNITY): Payer: Self-pay

## 2024-10-30 ENCOUNTER — Emergency Department (HOSPITAL_COMMUNITY)

## 2024-10-30 DIAGNOSIS — R1013 Epigastric pain: Secondary | ICD-10-CM | POA: Diagnosis present

## 2024-10-30 DIAGNOSIS — R110 Nausea: Secondary | ICD-10-CM | POA: Insufficient documentation

## 2024-10-30 DIAGNOSIS — R1012 Left upper quadrant pain: Secondary | ICD-10-CM | POA: Insufficient documentation

## 2024-10-30 DIAGNOSIS — Z79899 Other long term (current) drug therapy: Secondary | ICD-10-CM | POA: Diagnosis not present

## 2024-10-30 DIAGNOSIS — I10 Essential (primary) hypertension: Secondary | ICD-10-CM | POA: Diagnosis not present

## 2024-10-30 LAB — CBC WITH DIFFERENTIAL/PLATELET
Abs Immature Granulocytes: 0.08 K/uL — ABNORMAL HIGH (ref 0.00–0.07)
Basophils Absolute: 0.1 K/uL (ref 0.0–0.1)
Basophils Relative: 1 %
Eosinophils Absolute: 0.3 K/uL (ref 0.0–0.5)
Eosinophils Relative: 2 %
HCT: 39.8 % (ref 36.0–46.0)
Hemoglobin: 13.3 g/dL (ref 12.0–15.0)
Immature Granulocytes: 1 %
Lymphocytes Relative: 13 %
Lymphs Abs: 1.7 K/uL (ref 0.7–4.0)
MCH: 29.4 pg (ref 26.0–34.0)
MCHC: 33.4 g/dL (ref 30.0–36.0)
MCV: 88.1 fL (ref 80.0–100.0)
Monocytes Absolute: 0.7 K/uL (ref 0.1–1.0)
Monocytes Relative: 5 %
Neutro Abs: 10.6 K/uL — ABNORMAL HIGH (ref 1.7–7.7)
Neutrophils Relative %: 78 %
Platelets: 280 K/uL (ref 150–400)
RBC: 4.52 MIL/uL (ref 3.87–5.11)
RDW: 12.8 % (ref 11.5–15.5)
WBC: 13.4 K/uL — ABNORMAL HIGH (ref 4.0–10.5)
nRBC: 0 % (ref 0.0–0.2)

## 2024-10-30 LAB — URINALYSIS, ROUTINE W REFLEX MICROSCOPIC
Bilirubin Urine: NEGATIVE
Glucose, UA: NEGATIVE mg/dL
Hgb urine dipstick: NEGATIVE
Ketones, ur: NEGATIVE mg/dL
Leukocytes,Ua: NEGATIVE
Nitrite: NEGATIVE
Protein, ur: NEGATIVE mg/dL
Specific Gravity, Urine: 1.008 (ref 1.005–1.030)
pH: 7 (ref 5.0–8.0)

## 2024-10-30 LAB — COMPREHENSIVE METABOLIC PANEL WITH GFR
ALT: 14 U/L (ref 0–44)
AST: 19 U/L (ref 15–41)
Albumin: 4.3 g/dL (ref 3.5–5.0)
Alkaline Phosphatase: 147 U/L — ABNORMAL HIGH (ref 38–126)
Anion gap: 13 (ref 5–15)
BUN: 18 mg/dL (ref 8–23)
CO2: 22 mmol/L (ref 22–32)
Calcium: 9.1 mg/dL (ref 8.9–10.3)
Chloride: 103 mmol/L (ref 98–111)
Creatinine, Ser: 0.68 mg/dL (ref 0.44–1.00)
GFR, Estimated: >60 mL/min
Glucose, Bld: 172 mg/dL — ABNORMAL HIGH (ref 70–99)
Potassium: 3.6 mmol/L (ref 3.5–5.1)
Sodium: 138 mmol/L (ref 135–145)
Total Bilirubin: 0.3 mg/dL (ref 0.0–1.2)
Total Protein: 6.9 g/dL (ref 6.5–8.1)

## 2024-10-30 LAB — LIPASE, BLOOD: Lipase: 41 U/L (ref 11–51)

## 2024-10-30 MED ORDER — ONDANSETRON 4 MG PO TBDP
4.0000 mg | ORAL_TABLET | Freq: Once | ORAL | Status: AC
  Administered 2024-10-30: 4 mg via ORAL
  Filled 2024-10-30: qty 1

## 2024-10-30 MED ORDER — IOHEXOL 350 MG/ML SOLN
75.0000 mL | Freq: Once | INTRAVENOUS | Status: AC | PRN
  Administered 2024-10-30: 75 mL via INTRAVENOUS

## 2024-10-30 MED ORDER — ONDANSETRON HCL 4 MG PO TABS: 4.0000 mg | ORAL_TABLET | Freq: Three times a day (TID) | ORAL | 0 refills | Status: AC | PRN

## 2024-10-30 NOTE — ED Triage Notes (Signed)
 Pt c/o left flank painx3d. Pt denies N/V or urinary issues

## 2024-10-30 NOTE — ED Provider Triage Note (Signed)
 Emergency Medicine Provider Triage Evaluation Note  [facility] , a 74 y.o. female  was evaluated in triage.  Pt complains of abdominal pain.  Pt began several weeks ago.   Review of Systems  Positive: nausea Negative: No vomiting  Physical Exam  BP (!) 174/83 (BP Location: Right Arm)   Pulse 71   Temp 97.7 F (36.5 C)   Resp 14   Ht 5' 3 (1.6 m)   Wt 67.1 kg   SpO2 96%   BMI 26.22 kg/m  Gen:   Awake, no distress   Resp:  Normal effort  MSK:   Moves extremities without difficulty  Other:    Medical Decision Making  Medically screening exam initiated at 4:13 PM.  Appropriate orders placed.  [facility] Segers was informed that the remainder of the evaluation will be completed by another provider, this initial triage assessment does not replace that evaluation, and the importance of remaining in the ED until their evaluation is complete.     Flint Sonny POUR, [place] 10/30/24 [phone]

## 2024-10-30 NOTE — Discharge Instructions (Addendum)
 Se le atendi en urgencias por dolor abdominal y nuseas. Los anlisis de [place], de orina y la tomografa computarizada de abdomen resultaron normales. Esto podra estar relacionado con su reflujo cido o lcera conocida. Contine tomando todos los medicamentos recetados anteriormente. Para las nuseas, le recetamos Zofran ; puede recogerlo en su farmacia. Regrese si presenta dolor intenso.  You were seen in the emerged permit for abdominal pain and nausea Your blood work urine test and CAT scan of your abdomen all looked okay This may be related to your acid reflux or known ulcer Continue taking all previous prescribed occasions For nausea we called in a prescription for Zofran  for you to pick up from your pharmacy Return for severe pain

## 2024-10-30 NOTE — ED Triage Notes (Signed)
 Interpretor used in triage.    She has had left sided abdomen pain for the past few weeks. Denies fever, chills, vomiting. She has been having nausea. Patient reports lower abdomen is more swollen than usual for her. She states her BM's have been regular.

## 2024-10-30 NOTE — ED Provider Notes (Signed)
 " Courtney Richard   CSN: [id number] Arrival date & time: 10/30/24  1535     Patient presents with: Abdominal Pain   [facility] [place] is a 74 y.o. female.  With a history of hiatal hernia, GERD peptic ulcer and hypertension presents to ED for abdominal pain.  Patient first experienced abdominal pain over several weeks ago.  Localized over epigastrium and left upper quadrant.  Feels similar to acid reflux pain.  Today this got worse when she leaned forward.  Associated nausea.  No urinary symptoms fevers chills vomiting or change in bowel habits.  Is taking prescribed medications including pantoprazole     Abdominal Pain      Prior to Admission medications  Medication Sig Start Date End Date Taking? Authorizing Provider  ondansetron  (ZOFRAN ) 4 MG tablet Take 1 tablet (4 mg total) by mouth every 8 (eight) hours as needed for up to 5 days for nausea or vomiting. 10/30/24 11/04/24 Yes Pamella Ozell LABOR, DO  albuterol  (VENTOLIN  HFA) 108 (90 Base) MCG/ACT inhaler Inhale 2 puffs into the lungs every 6 (six) hours as needed for wheezing or shortness of breath (Cough). 09/12/22   Joesph Shaver Scales, PA-C  amLODipine-valsartan (EXFORGE) 10-320 MG tablet Take 1 tablet by mouth daily.    [provider]  guaiFENesin  (ROBITUSSIN) 100 MG/5ML liquid Take 10 mLs (200 mg total) by mouth every 6 (six) hours as needed for cough or to loosen phlegm. 09/12/22   Joesph Shaver Scales, PA-C  metoprolol succinate (TOPROL-XL) 25 MG 24 hr tablet Take 25 mg by mouth daily.    [provider]  pantoprazole  (PROTONIX ) 40 MG tablet Take 1 tablet (40 mg total) by mouth daily. 02/19/22   Dasie Faden, MD  promethazine -dextromethorphan  (PROMETHAZINE -DM) 6.25-15 MG/5ML syrup Take 5 mLs by mouth at bedtime as needed for cough. 09/12/22   Joesph Shaver Scales, PA-C    Allergies: Patient has no known allergies.    Review of Systems   Gastrointestinal:  Positive for abdominal pain.    Updated Vital Signs BP (!) 176/84   Pulse 60   Temp 98 F (36.7 C) (Oral)   Resp 16   Ht 5' 3 (1.6 m)   Wt 67.1 kg   SpO2 100%   BMI 26.22 kg/m   Physical Exam Vitals and nursing Richard reviewed.  HENT:     Head: Normocephalic and atraumatic.  Eyes:     Pupils: Pupils are equal, round, and reactive to light.  Cardiovascular:     Rate and Rhythm: Normal rate and regular rhythm.  Pulmonary:     Effort: Pulmonary effort is normal.     Breath sounds: Normal breath sounds.  Abdominal:     Palpations: Abdomen is soft.     Tenderness: There is abdominal tenderness in the epigastric area and left upper quadrant.  Skin:    General: Skin is warm and dry.  Neurological:     Mental Status: She is alert.  Psychiatric:        Mood and Affect: Mood normal.     (all labs ordered are listed, but only abnormal results are displayed) Labs Reviewed  CBC WITH DIFFERENTIAL/PLATELET - Abnormal; Notable for the following components:      Result Value   WBC 13.4 (*)    Neutro Abs 10.6 (*)    Abs Immature Granulocytes 0.08 (*)    All other components within normal limits  COMPREHENSIVE METABOLIC PANEL WITH GFR - Abnormal;  Notable for the following components:   Glucose, Bld 172 (*)    Alkaline Phosphatase 147 (*)    All other components within normal limits  LIPASE, BLOOD  URINALYSIS, ROUTINE W REFLEX MICROSCOPIC    EKG: None  Radiology: CT ABDOMEN PELVIS W CONTRAST Result Date: 10/30/2024 CLINICAL DATA:  Abdominal pain. EXAM: CT ABDOMEN AND PELVIS WITH CONTRAST TECHNIQUE: Multidetector CT imaging of the abdomen and pelvis was performed using the standard protocol following bolus administration of intravenous contrast. RADIATION DOSE REDUCTION: This exam was performed according to the departmental dose-optimization program which includes automated exposure control, adjustment of the mA and/or kV according to patient size and/or use  of iterative reconstruction technique. CONTRAST:  75mL OMNIPAQUE  IOHEXOL  350 MG/ML SOLN COMPARISON:  Abdominal MRI dated 10/25/2023. FINDINGS: Lower chest: The visualized lung bases are clear. No intra-abdominal free air or free fluid. Hepatobiliary: The liver is unremarkable. No biliary ductal dilatation. The gallbladder is unremarkable Pancreas: Unremarkable. No pancreatic ductal dilatation or surrounding inflammatory changes. Spleen: Normal in size without focal abnormality. Adrenals/Urinary Tract: The adrenal glands unremarkable. Small right renal cyst better evaluated on prior MRI. There is no hydronephrosis on either side. There is symmetric enhancement and excretion of contrast by both kidneys. The visualized ureters and urinary bladder unremarkable. Stomach/Bowel: There is sigmoid diverticulosis. There is no bowel obstruction or active inflammation. The appendix is not visualized with certainty. No inflammatory changes identified in the right lower quadrant. Vascular/Lymphatic: Mild aortoiliac atherosclerotic disease. The IVC is unremarkable. No portal venous gas. There is no adenopathy. Reproductive: Hysterectomy.  No suspicious adnexal masses. Other: None Musculoskeletal: Degenerative changes of the spine. No acute osseous pathology. IMPRESSION: 1. No acute intra-abdominal or pelvic pathology. 2. Sigmoid diverticulosis. No bowel obstruction. 3.  Aortic Atherosclerosis (ICD10-I70.0). Electronically Signed   By: Vanetta Chou M.D.   On: 10/30/2024 18:22     Procedures   Medications Ordered in the ED  ondansetron  (ZOFRAN -ODT) disintegrating tablet 4 mg (has no administration in time range)  iohexol  (OMNIPAQUE ) 350 MG/ML injection 75 mL (75 mLs Intravenous Contrast Given 10/30/24 1815)    Clinical Course as of 10/30/24 1933  Tue Oct 30, 2024  1931 No acute findings on laboratory workup.  Alk phos at baseline.  UA negative.  CT abdomen pelvis unremarkable.  This may be related to her GERD or  known hiatal hernia.  Counseled on symptomatic management and will discharge with Zofran  and instruction for PCP follow-up [MP]    Clinical Course User Index [MP] Pamella Ozell LABOR, DO                                 Medical Decision Making 74 year old female with history as above presented to the ED for abdominal pain with associated nausea.  Localized over epigastrium and left upper quadrant.  Differential diagnosis includes: Acute intra-abdominal infectious/inflammatory process such as appendicitis, diverticulitis, pancreatitis and cholecystitis Urinary tract infection Atypical presentation for pneumonia Viral gastroenteritis  Will obtain laboratory workup including CBC with differential, metabolic panel, lipase and urinalysis along with CT abdomen pelvis        Final diagnoses:  Epigastric pain    ED Discharge Orders          Ordered    ondansetron  (ZOFRAN ) 4 MG tablet  Every 8 hours PRN        10/30/24 1932               Pamella,  Ozell LABOR, DO 10/30/24 1933  "

## 2024-12-11 ENCOUNTER — Ambulatory Visit
